# Patient Record
Sex: Female | Born: 1975 | Race: White | Hispanic: No | Marital: Married | Smoking: Never smoker
Health system: Southern US, Community
[De-identification: ages and names within clinical notes are randomized; demographics above are authoritative.]

## PROBLEM LIST (undated history)

## (undated) DIAGNOSIS — H1010 Acute atopic conjunctivitis, unspecified eye: Secondary | ICD-10-CM

## (undated) DIAGNOSIS — H7291 Unspecified perforation of tympanic membrane, right ear: Secondary | ICD-10-CM

## (undated) DIAGNOSIS — N390 Urinary tract infection, site not specified: Secondary | ICD-10-CM

## (undated) DIAGNOSIS — F32A Depression, unspecified: Secondary | ICD-10-CM

## (undated) DIAGNOSIS — J309 Allergic rhinitis, unspecified: Secondary | ICD-10-CM

## (undated) DIAGNOSIS — H900 Conductive hearing loss, bilateral: Secondary | ICD-10-CM

## (undated) DIAGNOSIS — F329 Major depressive disorder, single episode, unspecified: Secondary | ICD-10-CM

## (undated) DIAGNOSIS — M2021 Hallux rigidus, right foot: Secondary | ICD-10-CM

## (undated) DIAGNOSIS — J45909 Unspecified asthma, uncomplicated: Secondary | ICD-10-CM

## (undated) HISTORY — DX: Acute atopic conjunctivitis, unspecified eye: H10.10

## (undated) HISTORY — DX: Acute atopic conjunctivitis, unspecified eye: J30.9

## (undated) HISTORY — PX: MIDDLE EAR SURGERY: SHX713

## (undated) HISTORY — DX: Depression, unspecified: F32.A

## (undated) HISTORY — PX: TYMPANOSTOMY TUBE PLACEMENT: SHX32

## (undated) HISTORY — DX: Major depressive disorder, single episode, unspecified: F32.9

## (undated) HISTORY — PX: TONSILLECTOMY: SUR1361

## (undated) HISTORY — DX: Unspecified perforation of tympanic membrane, right ear: H72.91

## (undated) HISTORY — DX: Hallux rigidus, right foot: M20.21

## (undated) HISTORY — DX: Urinary tract infection, site not specified: N39.0

## (undated) HISTORY — DX: Unspecified asthma, uncomplicated: J45.909

## (undated) HISTORY — DX: Conductive hearing loss, bilateral: H90.0

---

## 2005-08-25 ENCOUNTER — Other Ambulatory Visit: Admission: RE | Admit: 2005-08-25 | Discharge: 2005-08-25 | Payer: Self-pay | Admitting: Obstetrics and Gynecology

## 2006-03-11 ENCOUNTER — Inpatient Hospital Stay (HOSPITAL_COMMUNITY): Admission: AD | Admit: 2006-03-11 | Discharge: 2006-03-14 | Payer: Self-pay | Admitting: Obstetrics and Gynecology

## 2016-07-13 DIAGNOSIS — Z6823 Body mass index (BMI) 23.0-23.9, adult: Secondary | ICD-10-CM | POA: Diagnosis not present

## 2016-07-13 DIAGNOSIS — Z1231 Encounter for screening mammogram for malignant neoplasm of breast: Secondary | ICD-10-CM | POA: Diagnosis not present

## 2016-07-13 DIAGNOSIS — Z01419 Encounter for gynecological examination (general) (routine) without abnormal findings: Secondary | ICD-10-CM | POA: Diagnosis not present

## 2017-07-20 DIAGNOSIS — Z1212 Encounter for screening for malignant neoplasm of rectum: Secondary | ICD-10-CM | POA: Diagnosis not present

## 2017-07-20 DIAGNOSIS — Z01419 Encounter for gynecological examination (general) (routine) without abnormal findings: Secondary | ICD-10-CM | POA: Diagnosis not present

## 2017-07-20 DIAGNOSIS — Z1231 Encounter for screening mammogram for malignant neoplasm of breast: Secondary | ICD-10-CM | POA: Diagnosis not present

## 2017-07-20 DIAGNOSIS — Z6823 Body mass index (BMI) 23.0-23.9, adult: Secondary | ICD-10-CM | POA: Diagnosis not present

## 2018-03-05 DIAGNOSIS — J0110 Acute frontal sinusitis, unspecified: Secondary | ICD-10-CM | POA: Diagnosis not present

## 2018-04-14 ENCOUNTER — Ambulatory Visit: Payer: Self-pay | Admitting: Family Medicine

## 2018-04-18 ENCOUNTER — Encounter: Payer: Self-pay | Admitting: Family Medicine

## 2018-04-18 ENCOUNTER — Ambulatory Visit: Payer: BLUE CROSS/BLUE SHIELD | Admitting: Family Medicine

## 2018-04-18 VITALS — BP 126/74 | HR 74 | Temp 98.1°F | Resp 16 | Ht 64.0 in | Wt 148.2 lb

## 2018-04-18 DIAGNOSIS — J3089 Other allergic rhinitis: Secondary | ICD-10-CM

## 2018-04-18 DIAGNOSIS — F329 Major depressive disorder, single episode, unspecified: Secondary | ICD-10-CM | POA: Insufficient documentation

## 2018-04-18 DIAGNOSIS — J329 Chronic sinusitis, unspecified: Secondary | ICD-10-CM | POA: Diagnosis not present

## 2018-04-18 DIAGNOSIS — F32A Depression, unspecified: Secondary | ICD-10-CM | POA: Insufficient documentation

## 2018-04-18 MED ORDER — FLUTICASONE PROPIONATE 50 MCG/ACT NA SUSP: 2.0000 | Freq: Every day | NASAL | 6 refills | Status: DC

## 2018-04-18 MED ORDER — CLINDAMYCIN HCL 300 MG PO CAPS: ORAL_CAPSULE | ORAL | 0 refills | Status: DC

## 2018-04-18 MED ORDER — PREDNISONE 20 MG PO TABS: ORAL_TABLET | ORAL | 0 refills | Status: DC

## 2018-04-18 NOTE — Patient Instructions (Signed)
Buy generic over the counter eye drops for itchy/allergy eyes called zaditor.  Use saline nasal spray: 2 sprays each nostril 2-3 times per day to irrigate and moisturize your nasal passages.  Xray of your sinuses has been ordered for [facility].

## 2018-04-18 NOTE — Progress Notes (Signed)
Office Note 04/20/2018  CC:  Chief Complaint  Patient presents with  . Establish Care    Previous PCP: no PCP, only seeing GYN prn check ups  . Sinus Issues    HPI:  Angela Harper is a 42 y.o. female who is here to establish care and discuss "sinus issues" Patient's most recent primary MD: GYN MD-->[facility]. Old records were not reviewed prior to or during today's visit.  "Sinus issues": Onset about 12 yrs ago while pregnant, nasal congestion, was put on zyrtec. Worsening over the last 2 yrs or so, EXTRA bad the last 1 yr. Chronic sneezing, nasal congestion/drainage, dry/itchy feeling in throat causes cough. Saline spray in nose doesn't feel like it gets through.  Lots of sinus pressure on/off. Swollen under eyes, dark circles.  Had a sinus infection about 1 mo ago, took abx, and sx's improved--back to her baseline. Has had some wheezing and some tightness in chest.  Snoring much worse lately.  Some feeling of SOB--primarily b/c she feels like she cannot breath out of her nose well. She did have EIB as a child.  Has noted decreased hearing in ears lately. Lots of nasal/postnasal mucous. No hx of x-ray of sinuses and has never seen an ENT or allergist for these problems.  Past Medical History:  Diagnosis Date  . Depression   . UTI (urinary tract infection)     Past Surgical History:  Procedure Laterality Date  . 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  . TONSILLECTOMY     child  . TYMPANOSTOMY TUBE PLACEMENT     child    Family History  Problem Relation Age of Onset  . Hearing loss Father     Social History   Socioeconomic History  . Marital status: Married    Spouse name: Not on file  . Number of children: Not on file  . Years of education: Not on file  . Highest education level: Not on file  Occupational History  . Not on file  Social Needs  . Financial  resource strain: Not on file  . Food insecurity:    Worry: Not on file    Inability: Not on file  . Transportation needs:    Medical: Not on file    Non-medical: Not on file  Tobacco Use  . Smoking status: Never Smoker  . Smokeless tobacco: Never Used  Substance and Sexual Activity  . Alcohol use: Not on file  . Drug use: Not on file  . Sexual activity: Not on file  Lifestyle  . Physical activity:    Days per week: Not on file    Minutes per session: Not on file  . Stress: Not on file  Relationships  . Social connections:    Talks on phone: Not on file    Gets together: Not on file    Attends religious service: Not on file    Active member of club or organization: Not on file    Attends meetings of clubs or organizations: Not on file    Relationship status: Not on file  . Intimate partner violence:    Fear of current or ex partner: Not on file    Emotionally abused: Not on file    Physically abused: Not on file    Forced sexual activity: Not on file  Other Topics Concern  . Not on file  Social History Narrative  . Not  on file    Outpatient Encounter Medications as of 04/18/2018  Medication Sig  . LO LOESTRIN FE 1 MG-10 MCG / 10 MCG tablet Take 1 tablet by mouth daily.  . Multiple Vitamin (MULTIVITAMIN) tablet Take 1 tablet by mouth daily.  . Probiotic Product (PROBIOTIC PO) Take 1 capsule by mouth daily.  . clindamycin (CLEOCIN) 300 MG capsule 1 tab po with food tid x 14d  . fluticasone (FLONASE) 50 MCG/ACT nasal spray Place 2 sprays into both nostrils daily.  . predniSONE (DELTASONE) 20 MG tablet 2 tabs po qd x 5d, then 1 tab po qd x 5d   No facility-administered encounter medications on file as of 04/18/2018.     Allergies  Allergen Reactions  . Codeine Nausea Only    ROS Review of Systems  Constitutional: Negative for fatigue and fever.  HENT: Positive for congestion, ear pain, facial swelling, postnasal drip, rhinorrhea, sinus pressure, sinus pain and  sneezing. Negative for sore throat.        See hpi  Eyes: Negative for visual disturbance.  Respiratory: Positive for chest tightness and wheezing. Negative for cough.        See hpi  Cardiovascular: Negative for chest pain.  Gastrointestinal: Negative for abdominal pain and nausea.  Genitourinary: Negative for dysuria.  Musculoskeletal: Negative for back pain and joint swelling.  Skin: Negative for rash.  Neurological: Negative for weakness and headaches.  Hematological: Negative for adenopathy.    PE; Blood pressure 126/74, pulse 74, temperature 98.1 F (36.7 C), temperature source Oral, resp. rate 16, height 5\' 4"  (1.626 m), weight 148 lb 4 oz (67.2 kg), SpO2 100 %. Body mass index is 25.45 kg/m.  VS: noted--normal. Gen: alert, NAD, Well APPEARING. HEENT: eyes without injection, drainage, or swelling.  Ears: EACs clear, R TM is pearly white, with no light reflex.  TM is not translucent.  No erythema or bulging.  No perforation.  L TM with normal translucency, with some calcification/scarring on posterior 1/3.  No erythema or bulging or middle ear fluid noted.  TM intact.  Nose: Clear rhinorrhea, with some dried, crusty exudate adherent to mildly injected mucosa.  No purulent d/c.  Diffuse mild paranasal and frontal sinus TTP.  No facial swelling.  Throat and mouth without focal lesion.  No pharyngial swelling, erythema, or exudate.  Soft palate: uvula is normal size and is midline, but the left side of the soft palate droops (or the right side is slightly atrophied).  Both sides of soft palate elevate similarly.  No tonsillar tissue. Neck: supple, no LAD.   LUNGS: CTA bilat, nonlabored resps.   CV: RRR, no m/r/g. EXT: no c/c/e SKIN: no rash   Pertinent labs:  none  ASSESSMENT AND PLAN:   New pt:  All primary care via her GYN MD up to this point.  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 X-ray sinuses ordered. Clindamycin 300 tid x  14d. Prednisone 40 mg qd x 5d, then 20mg  qd x 5d. Continue daily zyrtec (she finds that this works better than any other nonsedating antihistamine for her), add flonase daily. Saline nasal spray treatments recommended. For eyes, try otc generic zaditor drops. Consider allergist vs ENT referral.  Spent 30 min with pt today, with >50% of this time spent in counseling and care coordination regarding the above problems.  An After Visit Summary was printed and given to the patient.  Return in about 3 weeks (around 05/09/2018) for f/u allergies.  Signed:  Santiago Bumpers, MD           04/20/2018

## 2018-04-20 ENCOUNTER — Encounter: Payer: Self-pay | Admitting: Family Medicine

## 2018-05-18 ENCOUNTER — Ambulatory Visit (HOSPITAL_BASED_OUTPATIENT_CLINIC_OR_DEPARTMENT_OTHER)
Admission: RE | Admit: 2018-05-18 | Discharge: 2018-05-18 | Disposition: A | Discharge status: Home / Self Care | Payer: BLUE CROSS/BLUE SHIELD | Source: Ambulatory Visit | Attending: Family Medicine | Admitting: Family Medicine

## 2018-05-18 DIAGNOSIS — J329 Chronic sinusitis, unspecified: Secondary | ICD-10-CM | POA: Diagnosis not present

## 2018-05-18 DIAGNOSIS — J3089 Other allergic rhinitis: Secondary | ICD-10-CM

## 2018-05-18 DIAGNOSIS — J309 Allergic rhinitis, unspecified: Secondary | ICD-10-CM | POA: Diagnosis not present

## 2018-05-18 DIAGNOSIS — J342 Deviated nasal septum: Secondary | ICD-10-CM | POA: Diagnosis not present

## 2018-05-27 ENCOUNTER — Encounter: Payer: Self-pay | Admitting: Family Medicine

## 2018-05-27 ENCOUNTER — Ambulatory Visit: Payer: BLUE CROSS/BLUE SHIELD | Admitting: Family Medicine

## 2018-05-27 VITALS — BP 119/77 | HR 65 | Temp 98.2°F | Resp 16 | Ht 64.0 in | Wt 150.1 lb

## 2018-05-27 DIAGNOSIS — H66016 Acute suppurative otitis media with spontaneous rupture of ear drum, recurrent, bilateral: Secondary | ICD-10-CM | POA: Diagnosis not present

## 2018-05-27 DIAGNOSIS — J329 Chronic sinusitis, unspecified: Secondary | ICD-10-CM

## 2018-05-27 DIAGNOSIS — H9193 Unspecified hearing loss, bilateral: Secondary | ICD-10-CM | POA: Diagnosis not present

## 2018-05-27 DIAGNOSIS — J309 Allergic rhinitis, unspecified: Secondary | ICD-10-CM

## 2018-05-27 DIAGNOSIS — J4520 Mild intermittent asthma, uncomplicated: Secondary | ICD-10-CM

## 2018-05-27 MED ORDER — CEFDINIR 300 MG PO CAPS: 300.0000 mg | ORAL_CAPSULE | Freq: Two times a day (BID) | ORAL | 0 refills | Status: AC

## 2018-05-27 MED ORDER — OFLOXACIN 0.3 % OT SOLN: 10.0000 [drp] | Freq: Every day | OTIC | 0 refills | Status: AC

## 2018-05-27 MED ORDER — MONTELUKAST SODIUM 10 MG PO TABS: 10.0000 mg | ORAL_TABLET | Freq: Every day | ORAL | 3 refills | Status: DC

## 2018-05-27 MED ORDER — ALBUTEROL SULFATE HFA 108 (90 BASE) MCG/ACT IN AERS: 2.0000 | INHALATION_SPRAY | RESPIRATORY_TRACT | 0 refills | Status: DC | PRN

## 2018-05-27 NOTE — Progress Notes (Signed)
OFFICE VISIT  05/29/2018   CC:  Chief Complaint  Patient presents with  . Follow-up    allergies   HPI:    Patient is a 42 y.o. Caucasian female who presents for 5 week f/u allergic rhinitis and sinusitis problems. Assessment and plan at last visit:  Severe chronic rhinosinusitis: combination of allergic and infectious causes. X-ray sinuses ordered. Clindamycin 300 tid x 14d. Prednisone 40 mg qd x 5d, then 20mg  qd x 5d. Continue daily zyrtec (she finds that this works better than any other nonsedating antihistamine for her), add flonase daily. Saline nasal spray treatments recommended. For eyes, try otc generic zaditor drops. Consider allergist vs ENT referral.  Interim hx: X-ray sinuses showed sinuses all completely normal.  She did get better for a while-->Was sneezing less, eyes not as itchy, not as congested in sinuses/nose.  Throat scratchy and dry cough better.  Then all sx's returned to the pretreatment level. She does feel like she sometimes can't get deep breaths, worse at night and when supine. Occ hears wheeze.  Has long hx of hearing deficit per her report due to recurrent ear infections as a child. Asks if she can be referred to an ENT for this, esp since she has hx of middle ear reconstructive surgery.     Past Medical History:  Diagnosis Date  . Depression   . UTI (urinary tract infection)     Past Surgical History:  Procedure Laterality Date  . 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  . TONSILLECTOMY     child  . TYMPANOSTOMY TUBE PLACEMENT Bilateral    child    Outpatient Medications Prior to Visit  Medication Sig Dispense Refill  . fluticasone (FLONASE) 50 MCG/ACT nasal spray Place 2 sprays into both nostrils daily. 16 g 6  . LO LOESTRIN FE 1 MG-10 MCG / 10 MCG tablet Take 1 tablet by mouth daily.  2  . Multiple Vitamin  (MULTIVITAMIN) tablet Take 1 tablet by mouth daily.    . Probiotic Product (PROBIOTIC PO) Take 1 capsule by mouth daily.    . clindamycin (CLEOCIN) 300 MG capsule 1 tab po with food tid x 14d (Patient not taking: Reported on 05/27/2018) 42 capsule 0  . predniSONE (DELTASONE) 20 MG tablet 2 tabs po qd x 5d, then 1 tab po qd x 5d (Patient not taking: Reported on 05/27/2018) 15 tablet 0   No facility-administered medications prior to visit.     Allergies  Allergen Reactions  . Codeine Nausea Only    ROS As per HPI  PE: Blood pressure 119/77, pulse 65, temperature 98.2 F (36.8 C), temperature source Oral, resp. rate 16, height 5\' 4"  (1.626 m), weight 150 lb 2 oz (68.1 kg), SpO2 100 %. VS: noted--normal. Gen: alert, NAD, NONTOXIC APPEARING. HEENT: eyes without injection, drainage, or swelling.  Ears: EACs clear, TMs with normal light reflex and landmarks.  Nose: No rhinorrhea.  Minimal crusty exudate adherent to mildly injected and edematous mucosa.  No purulent d/c.  No paranasal sinus TTP.  No facial swelling.  Throat and mouth without focal lesion.  No pharyngial swelling, erythema, or exudate.   Neck: supple, no LAD.   LUNGS: CTA bilat, nonlabored resps.   CV: RRR, no m/r/g. EXT: no c/c/e SKIN: no rash  LABS: None    IMPRESSION AND PLAN:  1) Allergic rhinitis, chronic.  Severe  sx's recently, mild short term improvement with prednisone and clinamycin tx. Referred pt to allergist for possible allergy testing and expert recommendations. Encouraged pt to take her flonase DAILY and not just prn. She can continue nonsedating antihistamine daily on prn basis. Start singulair 10mg  qd.  2) Intermittent asthma suspected: trial of ventolin 2 p q4h prn. Allergy/asthma specialist referral. Singulair started.  3) R acute OM, with chronic TM perforation: cefdinir 300 mg bid x 7d. Floxic otic drops: 10 gtts in R ear qd.  4) Hearing loss AU: hx of recurrent OM and hx of middle ear  reconstruction surgery. ENT referral ordered today. Audiogram today showed only small deficit in both ears at 500, 1000, and 2000 Hz frequencies.  An After Visit Summary was printed and given to the patient.  FOLLOW UP: Return for as needed.  Signed:  Santiago BumpersPhil Mateo Overbeck, MD           05/29/2018

## 2018-06-02 DIAGNOSIS — H900 Conductive hearing loss, bilateral: Secondary | ICD-10-CM | POA: Diagnosis not present

## 2018-06-02 DIAGNOSIS — H7201 Central perforation of tympanic membrane, right ear: Secondary | ICD-10-CM | POA: Diagnosis not present

## 2018-06-02 DIAGNOSIS — H7291 Unspecified perforation of tympanic membrane, right ear: Secondary | ICD-10-CM | POA: Diagnosis not present

## 2018-06-02 DIAGNOSIS — H6983 Other specified disorders of Eustachian tube, bilateral: Secondary | ICD-10-CM | POA: Diagnosis not present

## 2018-06-21 ENCOUNTER — Encounter: Payer: Self-pay | Admitting: Allergy and Immunology

## 2018-06-21 ENCOUNTER — Ambulatory Visit: Payer: BLUE CROSS/BLUE SHIELD | Admitting: Allergy and Immunology

## 2018-06-21 VITALS — BP 118/82 | HR 80 | Resp 16 | Ht 64.0 in | Wt 148.0 lb

## 2018-06-21 DIAGNOSIS — H1013 Acute atopic conjunctivitis, bilateral: Secondary | ICD-10-CM

## 2018-06-21 DIAGNOSIS — J302 Other seasonal allergic rhinitis: Secondary | ICD-10-CM | POA: Insufficient documentation

## 2018-06-21 DIAGNOSIS — H1010 Acute atopic conjunctivitis, unspecified eye: Secondary | ICD-10-CM | POA: Insufficient documentation

## 2018-06-21 DIAGNOSIS — J3089 Other allergic rhinitis: Secondary | ICD-10-CM | POA: Diagnosis not present

## 2018-06-21 DIAGNOSIS — J4530 Mild persistent asthma, uncomplicated: Secondary | ICD-10-CM | POA: Diagnosis not present

## 2018-06-21 MED ORDER — LEVOCETIRIZINE DIHYDROCHLORIDE 5 MG PO TABS: 5.0000 mg | ORAL_TABLET | Freq: Every evening | ORAL | 5 refills | Status: DC

## 2018-06-21 MED ORDER — FLUTICASONE PROPIONATE 93 MCG/ACT NA EXHU: 2.0000 | INHALANT_SUSPENSION | Freq: Two times a day (BID) | NASAL | 3 refills | Status: DC

## 2018-06-21 MED ORDER — OLOPATADINE HCL 0.7 % OP SOLN: 1.0000 [drp] | OPHTHALMIC | 5 refills | Status: DC

## 2018-06-21 NOTE — Assessment & Plan Note (Signed)
   Treatment plan as outlined above for allergic rhinitis.  A prescription has been provided for Pazeo, one drop per eye daily as needed.  I have also recommended eye lubricant drops (i.e., Natural Tears) as needed. 

## 2018-06-21 NOTE — Patient Instructions (Addendum)
Seasonal and perennial allergic rhinitis  Aeroallergen avoidance measures have been discussed and provided in written form.  A prescription has been provided for levocetirizine, 5 mg daily as needed.  A prescription has been provided for [facility], 2 actuations per nostril twice a day. Proper technique has been discussed and demonstrated.  Nasal saline spray (i.e., Simply Saline) or nasal saline lavage (i.e., NeilMed) is recommended as needed and prior to medicated nasal sprays.  If allergen avoidance measures and medications fail to adequately relieve symptoms, aeroallergen immunotherapy will be considered.  Allergic conjunctivitis  Treatment plan as outlined above for allergic rhinitis.  A prescription has been provided for Pazeo, one drop per eye daily as needed.  I have also recommended eye lubricant drops (i.e., Natural Tears) as needed.  Mild asthma  Continue montelukast 10 mg daily at bedtime and albuterol HFA, 1 to 2 inhalations every 4-6 hours if needed.  I have also recommended using albuterol 15 minutes prior to vigorous exercise.  Subjective and objective measures of pulmonary function will be followed and the treatment plan will be adjusted accordingly.   Return in about 3 months (around 09/20/2018), or if symptoms worsen or fail to improve.  Control of Dust Mite Allergen  House dust mites play a major role in allergic asthma and rhinitis.  They occur in environments with high humidity wherever human skin, the food for dust mites is found. High levels have been detected in dust obtained from mattresses, pillows, carpets, upholstered furniture, bed covers, clothes and soft toys.  The principal allergen of the house dust mite is found in its feces.  A gram of dust may contain 1,000 mites and 250,000 fecal particles.  Mite antigen is easily measured in the air during house cleaning activities.    1. Encase mattresses, including the box spring, and pillow, in an air tight  cover.  Seal the zipper end of the encased mattresses with wide adhesive tape. 2. Wash the bedding in water of 130 degrees Farenheit weekly.  Avoid cotton comforters/quilts and flannel bedding: the most ideal bed covering is the dacron comforter. 3. Remove all upholstered furniture from the bedroom. 4. Remove carpets, carpet padding, rugs, and non-washable window drapes from the bedroom.  Wash drapes weekly or use plastic window coverings. 5. Remove all non-washable stuffed toys from the bedroom.  Wash stuffed toys weekly. 6. Have the room cleaned frequently with a vacuum cleaner and a damp dust-mop.  The patient should not be in a room which is being cleaned and should wait 1 hour after cleaning before going into the room. 7. Close and seal all heating outlets in the bedroom.  Otherwise, the room will become filled with dust-laden air.  An electric heater can be used to heat the room. Reduce indoor humidity to less than 50%.  Do not use a humidifier.   Reducing Pollen Exposure  The American Academy of Allergy, Asthma and Immunology suggests the following steps to reduce your exposure to pollen during allergy seasons.    1. Do not hang sheets or clothing out to dry; pollen may collect on these items. 2. Do not mow lawns or spend time around freshly cut grass; mowing stirs up pollen. 3. Keep windows closed at night.  Keep car windows closed while driving. 4. Minimize morning activities outdoors, a time when pollen counts are usually at their highest. 5. Stay indoors as much as possible when pollen counts or humidity is high and on windy days when pollen tends to remain in  the air longer. 6. Use air conditioning when possible.  Many air conditioners have filters that trap the pollen spores. 7. Use a HEPA room air filter to remove pollen form the indoor air you breathe.   Control of Dog or Cat Allergen  Avoidance is the best way to manage a dog or cat allergy. If you have a dog or cat and are  allergic to dog or cats, consider removing the dog or cat from the home. If you have a dog or cat but don't want to find it a new home, or if your family wants a pet even though someone in the household is allergic, here are some strategies that may help keep symptoms at bay:  1. Keep the pet out of your bedroom and restrict it to only a few rooms. Be advised that keeping the dog or cat in only one room will not limit the allergens to that room. 2. Don't pet, hug or kiss the dog or cat; if you do, wash your hands with soap and water. 3. High-efficiency particulate air (HEPA) cleaners run continuously in a bedroom or living room can reduce allergen levels over time. 4. Place electrostatic material sheet in the air inlet vent in the bedroom. 5. Regular use of a high-efficiency vacuum cleaner or a central vacuum can reduce allergen levels. 6. Giving your dog or cat a bath at least once a week can reduce airborne allergen.   Control of Mold Allergen  Mold and fungi can grow on a variety of surfaces provided certain temperature and moisture conditions exist.  Outdoor molds grow on plants, decaying vegetation and soil.  The major outdoor mold, Alternaria and Cladosporium, are found in very high numbers during hot and dry conditions.  Generally, a late Summer - Fall peak is seen for common outdoor fungal spores.  Rain will temporarily lower outdoor mold spore count, but counts rise rapidly when the rainy period ends.  The most important indoor molds are Aspergillus and Penicillium.  Dark, humid and poorly ventilated basements are ideal sites for mold growth.  The next most common sites of mold growth are the bathroom and the kitchen.  Outdoor MicrosoftMold Control 1. Use air conditioning and keep windows closed 2. Avoid exposure to decaying vegetation. 3. Avoid leaf raking. 4. Avoid grain handling. 5. Consider wearing a face mask if working in moldy areas.  Indoor Mold Control 1. Maintain humidity below  50%. 2. Clean washable surfaces with 5% bleach solution. 3. Remove sources e.g. Contaminated carpets.   Control of Cockroach Allergen  Cockroach allergen has been identified as an important cause of acute attacks of asthma, especially in urban settings.  There are fifty-five species of cockroach that exist in the [place], however only three, the [place], [place] and Oriental species produce allergen that can affect patients with Asthma.  Allergens can be obtained from fecal particles, egg casings and secretions from cockroaches.    1. Remove food sources. 2. Reduce access to water. 3. Seal access and entry points. 4. Spray runways with 0.5-1% Diazinon or Chlorpyrifos 5. Blow boric acid power under stoves and refrigerator. 6. Place bait stations (hydramethylnon) at feeding sites.

## 2018-06-21 NOTE — Progress Notes (Signed)
New Patient Note  RE: Angela Harper MRN: [id number] DOB: [birth year] Date of Office Visit: 06/21/2018  Referring provider: Jeoffrey MassedMcGowen, Philip H, MD Primary care provider: Jeoffrey MassedMcGowen, Philip H, MD  Chief Complaint: Nasal Congestion and Itchy Eye   History of present illness: Angela Harper is a 42 y.o. female seen today in consultation requested by Nicoletta BaPhilip McGowen, MD.  She reports that she began to experience nasal and ocular allergy symptoms approximately 20 years ago during pregnancy.  This problem has progressed significantly over the past year.  Her symptoms consist of "very powerful, terrifying sneezes", nasal congestion, rhinorrhea, thick postnasal drainage, nasal pruritus, ocular pruritus, and mild eyelid edema.  No significant seasonal symptom variation has been noted nor have specific environmental triggers been identified.  She currently attempts to control the symptoms with fluticasone nasal spray and cetirizine or cetirizine/pseudoephedrine.  She reports that she has had a nonproductive cough over the past 3 months.  In early December she had symptoms consistent with a sinus infection and during that time experienced chest tightness and wheezing.  She states that she was diagnosed with asthma earlier this month and was prescribed montelukast 10 mg daily at bedtime and albuterol as needed.  She has required albuterol rescue 1 time per week on average and has experienced relief from the albuterol.  Assessment and plan: Seasonal and perennial allergic rhinitis  Aeroallergen avoidance measures have been discussed and provided in written form.  A prescription has been provided for levocetirizine, 5 mg daily as needed.  A prescription has been provided [facility], 2 actuations per nostril twice a day. Proper technique has been discussed and demonstrated.  Nasal saline spray (i.e., Simply Saline) or nasal saline lavage (i.e., NeilMed) is recommended as needed and prior to medicated  nasal sprays.  If allergen avoidance measures and medications fail to adequately relieve symptoms, aeroallergen immunotherapy will be considered.  Allergic conjunctivitis  Treatment plan as outlined above for allergic rhinitis.  A prescription has been provided for Pazeo, one drop per eye daily as needed.  I have also recommended eye lubricant drops (i.e., Natural Tears) as needed.  Mild asthma  Continue montelukast 10 mg daily at bedtime and albuterol HFA, 1 to 2 inhalations every 4-6 hours if needed.  I have also recommended using albuterol 15 minutes prior to vigorous exercise.  Subjective and objective measures of pulmonary function will be followed and the treatment plan will be adjusted accordingly.   Meds ordered this encounter  Medications  . Fluticasone Propionate (XHANCE) 93 MCG/ACT EXHU    Sig: Place 2 sprays into the nose 2 (two) times daily.    Dispense:  16 mL    Refill:  3  . Olopatadine HCl (PAZEO) 0.7 % SOLN    Sig: Place 1 drop into both eyes 1 day or 1 dose.    Dispense:  1 Bottle    Refill:  5  . levocetirizine (XYZAL) 5 MG tablet    Sig: Take 1 tablet (5 mg total) by mouth every evening.    Dispense:  30 tablet    Refill:  5    Diagnostics: Spirometry: Normal with an FEV1 of 107% predicted and an FEV1 ratio of 98%.  Please see scanned spirometry results for details. Environmental epicutaneous testing: Positive to grass pollen Environmental intradermal testing: Positive to ragweed pollen, molds cat hair, dog epithelia, cockroach antigen, and dust mite antigen.    Physical examination: Blood pressure 118/82, pulse 80, resp. rate 16, height 5\' 4"  (1.626  m), weight 148 lb (67.1 kg), SpO2 100 %.  General: Alert, interactive, in no acute distress. HEENT: TMs pearly gray, turbinates moderately edematous with clear discharge, post-pharynx erythematous. Neck: Supple without lymphadenopathy. Lungs: Clear to auscultation without wheezing, rhonchi or  rales. CV: Normal S1, S2 without murmurs. Abdomen: Nondistended, nontender. Skin: Warm and dry, without lesions or rashes. Extremities:  No clubbing, cyanosis or edema. Neuro:   Grossly intact.  Review of systems:  Review of systems negative except as noted in HPI / PMHx or noted below: Review of Systems  Constitutional: Negative.   HENT: Negative.   Eyes: Negative.   Respiratory: Negative.   Cardiovascular: Negative.   Gastrointestinal: Negative.   Genitourinary: Negative.   Musculoskeletal: Negative.   Skin: Negative.   Neurological: Negative.   Endo/Heme/Allergies: Negative.   Psychiatric/Behavioral: Negative.     Past medical history:  Past Medical History:  Diagnosis Date  . Asthma   . Depression   . UTI (urinary tract infection)     Past surgical history:  Past Surgical History:  Procedure Laterality Date  . CESAREAN SECTION    . 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  . TONSILLECTOMY     child  . TONSILLECTOMY    . TYMPANOSTOMY TUBE PLACEMENT Bilateral    child    Family history: Family History  Problem Relation Age of Onset  . Hearing loss Father   . Hypertension Father     Social history: Social History   Socioeconomic History  . Marital status: Married    Spouse name: Not on file  . Number of children: Not on file  . Years of education: Not on file  . Highest education level: Not on file  Occupational History  . Not on file  Social Needs  . Financial resource strain: Not on file  . Food insecurity:    Worry: Not on file    Inability: Not on file  . Transportation needs:    Medical: Not on file    Non-medical: Not on file  Tobacco Use  . Smoking status: Never Smoker  . Smokeless tobacco: Never Used  Substance and Sexual Activity  . Alcohol use: Not on file  . Drug use: Not on file  . Sexual activity: Not on  file  Lifestyle  . Physical activity:    Days per week: Not on file    Minutes per session: Not on file  . Stress: Not on file  Relationships  . Social connections:    Talks on phone: Not on file    Gets together: Not on file    Attends religious service: Not on file    Active member of club or organization: Not on file    Attends meetings of clubs or organizations: Not on file    Relationship status: Not on file  . Intimate partner violence:    Fear of current or ex partner: Not on file    Emotionally abused: Not on file    Physically abused: Not on file    Forced sexual activity: Not on file  Other Topics Concern  . Not on file  Social History Narrative  . Not on file   Environmental History: The patient lives in a 43 year old house with carpeting the bedroom and central air/heat.  There are 2 cats in the home which have access to her bedroom.  Allergies  as of 06/21/2018      Reactions   Codeine Nausea Only      Medication List       Accurate as of June 21, 2018  4:35 PM. Always use your most recent med list.        albuterol 108 (90 Base) MCG/ACT inhaler Commonly known as:  VENTOLIN HFA Inhale 2 puffs into the lungs every 4 (four) hours as needed for wheezing or shortness of breath.   fluticasone 50 MCG/ACT nasal spray Commonly known as:  FLONASE Place 2 sprays into both nostrils daily.   Fluticasone Propionate 93 MCG/ACT Exhu Commonly known as:  XHANCE Place 2 sprays into the nose 2 (two) times daily.   levocetirizine 5 MG tablet Commonly known as:  XYZAL Take 1 tablet (5 mg total) by mouth every evening.   LO LOESTRIN FE 1 MG-10 MCG / 10 MCG tablet Generic drug:  Norethindrone-Ethinyl Estradiol-Fe Biphas Take 1 tablet by mouth daily.   montelukast 10 MG tablet Commonly known as:  SINGULAIR Take 1 tablet (10 mg total) by mouth at bedtime.   multivitamin tablet Take 1 tablet by mouth daily.   Olopatadine HCl 0.7 % Soln Commonly known as:   PAZEO Place 1 drop into both eyes 1 day or 1 dose.   PROBIOTIC PO Take 1 capsule by mouth daily.       Known medication allergies: Allergies  Allergen Reactions  . Codeine Nausea Only    I appreciate the opportunity to take part in [facility] care. Please do not hesitate to contact me with questions.  Sincerely,   R. Jorene Guest, MD

## 2018-06-21 NOTE — Assessment & Plan Note (Signed)
   Aeroallergen avoidance measures have been discussed and provided in written form.  A prescription has been provided for levocetirizine, 5 mg daily as needed.  A prescription has been provided for Xhance, 2 actuations per nostril twice a day. Proper technique has been discussed and demonstrated.  Nasal saline spray (i.e., Simply Saline) or nasal saline lavage (i.e., NeilMed) is recommended as needed and prior to medicated nasal sprays.  If allergen avoidance measures and medications fail to adequately relieve symptoms, aeroallergen immunotherapy will be considered. 

## 2018-06-21 NOTE — Assessment & Plan Note (Signed)
   Continue montelukast 10 mg daily at bedtime and albuterol HFA, 1 to 2 inhalations every 4-6 hours if needed.  I have also recommended using albuterol 15 minutes prior to vigorous exercise.  Subjective and objective measures of pulmonary function will be followed and the treatment plan will be adjusted accordingly.

## 2018-06-28 DIAGNOSIS — H7091 Unspecified mastoiditis, right ear: Secondary | ICD-10-CM | POA: Diagnosis not present

## 2018-06-28 DIAGNOSIS — H900 Conductive hearing loss, bilateral: Secondary | ICD-10-CM | POA: Diagnosis not present

## 2018-06-28 DIAGNOSIS — H7201 Central perforation of tympanic membrane, right ear: Secondary | ICD-10-CM | POA: Diagnosis not present

## 2018-06-28 DIAGNOSIS — H6983 Other specified disorders of Eustachian tube, bilateral: Secondary | ICD-10-CM | POA: Diagnosis not present

## 2018-07-08 ENCOUNTER — Other Ambulatory Visit: Payer: Self-pay | Admitting: Family Medicine

## 2018-07-12 ENCOUNTER — Telehealth: Payer: Self-pay

## 2018-07-12 ENCOUNTER — Other Ambulatory Visit: Payer: Self-pay | Admitting: Otolaryngology

## 2018-07-12 DIAGNOSIS — H7091 Unspecified mastoiditis, right ear: Secondary | ICD-10-CM

## 2018-07-12 NOTE — Telephone Encounter (Signed)
Spoke with patient, she asked if it was okay to take an extra dose of levocetirizine if one was not clearing her symptoms.  I encouraged her to try taking one extra dose for one to two weeks to see if it helps, if it doesn't, she will call us back.

## 2018-07-18 ENCOUNTER — Ambulatory Visit
Admission: RE | Admit: 2018-07-18 | Discharge: 2018-07-18 | Disposition: A | Discharge status: Home / Self Care | Payer: BLUE CROSS/BLUE SHIELD | Source: Ambulatory Visit | Attending: Otolaryngology | Admitting: Otolaryngology

## 2018-07-18 ENCOUNTER — Other Ambulatory Visit: Payer: Self-pay | Admitting: Family Medicine

## 2018-07-18 ENCOUNTER — Other Ambulatory Visit: Payer: BLUE CROSS/BLUE SHIELD

## 2018-07-18 DIAGNOSIS — H7091 Unspecified mastoiditis, right ear: Secondary | ICD-10-CM

## 2018-07-18 DIAGNOSIS — H748X3 Other specified disorders of middle ear and mastoid, bilateral: Secondary | ICD-10-CM | POA: Diagnosis not present

## 2018-07-18 MED ORDER — IOPAMIDOL (ISOVUE-300) INJECTION 61%
75.0000 mL | Freq: Once | INTRAVENOUS | Status: AC | PRN
  Administered 2018-07-18: 75 mL via INTRAVENOUS

## 2018-09-13 ENCOUNTER — Ambulatory Visit: Payer: BLUE CROSS/BLUE SHIELD | Admitting: Allergy and Immunology

## 2018-09-25 ENCOUNTER — Encounter: Payer: Self-pay | Admitting: Family Medicine

## 2018-10-18 ENCOUNTER — Ambulatory Visit: Payer: BLUE CROSS/BLUE SHIELD | Admitting: Allergy and Immunology

## 2018-12-06 ENCOUNTER — Ambulatory Visit: Payer: Self-pay | Admitting: Allergy & Immunology

## 2018-12-06 ENCOUNTER — Other Ambulatory Visit: Payer: Self-pay

## 2018-12-13 ENCOUNTER — Encounter: Payer: Self-pay | Admitting: Allergy and Immunology

## 2018-12-13 ENCOUNTER — Other Ambulatory Visit: Payer: Self-pay

## 2018-12-13 ENCOUNTER — Ambulatory Visit (INDEPENDENT_AMBULATORY_CARE_PROVIDER_SITE_OTHER): Payer: 59 | Admitting: Allergy and Immunology

## 2018-12-13 DIAGNOSIS — J3089 Other allergic rhinitis: Secondary | ICD-10-CM | POA: Diagnosis not present

## 2018-12-13 DIAGNOSIS — J4530 Mild persistent asthma, uncomplicated: Secondary | ICD-10-CM

## 2018-12-13 DIAGNOSIS — H1013 Acute atopic conjunctivitis, bilateral: Secondary | ICD-10-CM

## 2018-12-13 MED ORDER — AZELASTINE HCL 0.15 % NA SOLN: 1.0000 | Freq: Two times a day (BID) | NASAL | 5 refills | Status: DC | PRN

## 2018-12-13 MED ORDER — LEVOCETIRIZINE DIHYDROCHLORIDE 5 MG PO TABS: 5.0000 mg | ORAL_TABLET | Freq: Every evening | ORAL | 5 refills | Status: DC

## 2018-12-13 NOTE — Patient Instructions (Addendum)
Seasonal and perennial allergic rhinitis Currently with suboptimal control.  A prescription has been provided for azelastine nasal spray, 1-2 sprays per nostril 2 times daily as needed. Proper nasal spray technique has been discussed.   If needed, may add fluticasone nasal spray.  Nasal saline spray (i.e., Simply Saline) or nasal saline lavage (i.e., NeilMed) is recommended as needed and prior to medicated nasal sprays.  A prescription has been provided for levocetirizine (Xyzal), 5 mg daily as needed.  To avoid diminishing benefit with daily use (tachyphylaxis) of second generation antihistamine, consider alternating every few months between fexofenadine (Allegra) and levocetirizine (Xyzal).  If allergen avoidance measures and medications fail to adequately relieve symptoms, aeroallergen immunotherapy will be considered.  Allergic conjunctivitis  Treatment plan as outlined above for allergic rhinitis.  Continue olopatadine eyedrops, 1 drop per eye daily if needed.  Mild asthma  Continue albuterol HFA, 1 to 2 inhalations every 4-6 hours if needed.  Subjective and objective measures of pulmonary function will be followed and the treatment plan will be adjusted accordingly.   Follow-up in 6 to 12 months or sooner if needed.

## 2018-12-13 NOTE — Progress Notes (Signed)
Follow-up Telemedicine Note  RE: Angela Harper MRN: [id number] DOB: July 30, 1975 Date of Telemedicine Visit: 12/13/2018  Primary care provider: Jeoffrey MassedMcGowen, Philip H, MD Referring provider: Jeoffrey MassedMcGowen, Philip H, MD  Telemedicine Follow Up Visit via Telephone: I connected with Angela Harper Harper for a follow up on 12/13/18 by telephone and verified that I am speaking with the correct person using two identifiers.   The limitations, risks, security and privacy concerns of performing an evaluation and management service by telemedicine, the availability of in person appointments, and that there may be a patient responsible charge related to this service were discussed. The patient expressed understanding and agreed to proceed.  Patient is at home.  Provider is at the office.  Visit start time: 1:34 PM Visit end time: 2:05 PM Insurance consent/check in by: Victorino DikeJennifer Medical consent and medical assistant/nurse: Toni Amendourtney  History of present illness: Angela Harper Mayon is a 43 y.o. female with mild persistent asthma and allergic rhinoconjunctivitis presenting today for follow-up.  She was previously seen in this clinic for her initial evaluation in December 2019.  She reports that her asthma has been well controlled in the interval since her previous visit.  Her normal daily activities are not limited by asthma symptoms and she is rarely awakened from sleep at night due to lower respiratory symptoms.  She discontinued montelukast because of the concern that it may be contributing to depression, though she suspects that the COVID-19 shelter in place orders were mainly to blame.  She currently takes cetirizine 10 mg daily and fluticasone nasal spray as needed/sporadically.  She reports that she has sneezing fits most mornings and also experiences nasal congestion along with occasional frontal sinus pressure.  Her ocular symptoms have been well controlled and she rarely requires olopatadine eyedrops.   Assessment and plan: Seasonal and perennial allergic rhinitis Currently with suboptimal control.  A prescription has been provided for azelastine nasal spray, 1-2 sprays per nostril 2 times daily as needed. Proper nasal spray technique has been discussed.   If needed, may add fluticasone nasal spray.  Nasal saline spray (i.e., Simply Saline) or nasal saline lavage (i.e., NeilMed) is recommended as needed and prior to medicated nasal sprays.  A prescription has been provided for levocetirizine (Xyzal), 5 mg daily as needed.  To avoid diminishing benefit with daily use (tachyphylaxis) of second generation antihistamine, consider alternating every few months between fexofenadine (Allegra) and levocetirizine (Xyzal).  If allergen avoidance measures and medications fail to adequately relieve symptoms, aeroallergen immunotherapy will be considered.  Allergic conjunctivitis  Treatment plan as outlined above for allergic rhinitis.  Continue olopatadine eyedrops, 1 drop per eye daily if needed.  Mild asthma  Continue albuterol HFA, 1 to 2 inhalations every 4-6 hours if needed.  Subjective and objective measures of pulmonary function will be followed and the treatment plan will be adjusted accordingly.   Meds ordered this encounter  Medications  . Azelastine HCl 0.15 % SOLN    Sig: Place 1-2 sprays into both nostrils 2 (two) times daily as needed.    Dispense:  30 mL    Refill:  5  . levocetirizine (XYZAL) 5 MG tablet    Sig: Take 1 tablet (5 mg total) by mouth every evening.    Dispense:  30 tablet    Refill:  5    Diagnostics: None.  Physical examination: Physical Exam Not obtained as encounter was done via telephone.   The following portions of the patient's history were reviewed and updated as appropriate:  allergies, current medications, past family history, past medical history, past social history, past surgical history and problem list.  Allergies as of 12/13/2018       Reactions   Codeine Nausea Only      Medication List       Accurate as of December 13, 2018  5:53 PM. If you have any questions, ask your nurse or doctor.        STOP taking these medications   montelukast 10 MG tablet Commonly known as: SINGULAIR Stopped by: Wellington Hampshire Carter Amarys Sliwinski, MD     TAKE these medications   albuterol 108 (90 Base) MCG/ACT inhaler Commonly known as: VENTOLIN HFA INHALE 2 PUFFS INTO THE LUNGS EVERY 4 (FOUR) HOURS AS NEEDED FOR WHEEZING OR SHORTNESS OF BREATH.   Azelastine HCl 0.15 % Soln Place 1-2 sprays into both nostrils 2 (two) times daily as needed. Started by: Wellington Hampshire Carter Marcelia Petersen, MD   cetirizine 10 MG tablet Commonly known as: ZYRTEC Take 10 mg by mouth daily.   fluticasone 50 MCG/ACT nasal spray Commonly known as: FLONASE Place 2 sprays into both nostrils daily.   Fluticasone Propionate 93 MCG/ACT Exhu Commonly known as: Xhance Place 2 sprays into the nose 2 (two) times daily.   levocetirizine 5 MG tablet Commonly known as: XYZAL Take 1 tablet (5 mg total) by mouth every evening.   Lo Loestrin Fe 1 MG-10 MCG / 10 MCG tablet Generic drug: Norethindrone-Ethinyl Estradiol-Fe Biphas Take 1 tablet by mouth daily.   multivitamin tablet Take 1 tablet by mouth daily.   Olopatadine HCl 0.7 % Soln Commonly known as: Pazeo Place 1 drop into both eyes 1 day or 1 dose.   PROBIOTIC PO Take 1 capsule by mouth daily.       Allergies  Allergen Reactions  . Codeine Nausea Only   Review of systems: Review of systems negative except as noted in HPI / PMHx or noted below: Constitutional: Negative.  HENT: Negative.   Eyes: Negative.  Respiratory: Negative.   Cardiovascular: Negative.  Gastrointestinal: Negative.  Genitourinary: Negative.  Musculoskeletal: Negative.  Neurological: Negative.  Endo/Heme/Allergies: Negative.  Cutaneous: Negative.  Past Medical History:  Diagnosis Date  . Asthma   . Conductive hearing loss, bilateral    Chronic R  TM perf.  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 Lazarus SalinesWolicki.(08/2018)  . Depression   . Perforation of right tympanic membrane    Chronic; tympanoplasty recommended 3/2020_>pt considering  . UTI (urinary tract infection)     Family History  Problem Relation Age of Onset  . Hearing loss Father   . Hypertension Father     Social History   Socioeconomic History  . Marital status: Married    Spouse name: Not on file  . Number of children: Not on file  . Years of education: Not on file  . Highest education level: Not on file  Occupational History  . Not on file  Social Needs  . Financial resource strain: Not on file  . Food insecurity    Worry: Not on file    Inability: Not on file  . Transportation needs    Medical: Not on file    Non-medical: Not on file  Tobacco Use  . Smoking status: Never Smoker  . Smokeless tobacco: Never Used  Substance and Sexual Activity  . Alcohol use: Not on file  . Drug use: Not on file  . Sexual activity: Not on file  Lifestyle  . Physical activity  Days per week: Not on file    Minutes per session: Not on file  . Stress: Not on file  Relationships  . Social Herbalist on phone: Not on file    Gets together: Not on file    Attends religious service: Not on file    Active member of club or organization: Not on file    Attends meetings of clubs or organizations: Not on file    Relationship status: Not on file  . Intimate partner violence    Fear of current or ex partner: Not on file    Emotionally abused: Not on file    Physically abused: Not on file    Forced sexual activity: Not on file  Other Topics Concern  . Not on file  Social History Narrative  . Not on file    Previous notes and tests were reviewed.  I discussed the assessment and treatment plan with the patient. The patient was provided an opportunity to ask questions and all were answered. The patient agreed with the plan and  demonstrated an understanding of the instructions.   The patient was advised to call back or seek an in-person evaluation if the symptoms worsen or if the condition fails to improve as anticipated.  I provided 31 minutes of non-face-to-face time during this encounter.  I appreciate the opportunity to take part in [facility] care. Please do not hesitate to contact me with questions.  Sincerely,   R. Edgar Frisk, MD

## 2018-12-13 NOTE — Assessment & Plan Note (Signed)
Currently with suboptimal control.  A prescription has been provided for azelastine nasal spray, 1-2 sprays per nostril 2 times daily as needed. Proper nasal spray technique has been discussed.   If needed, may add fluticasone nasal spray.  Nasal saline spray (i.e., Simply Saline) or nasal saline lavage (i.e., NeilMed) is recommended as needed and prior to medicated nasal sprays.  A prescription has been provided for levocetirizine (Xyzal), 5 mg daily as needed.  To avoid diminishing benefit with daily use (tachyphylaxis) of second generation antihistamine, consider alternating every few months between fexofenadine (Allegra) and levocetirizine (Xyzal).  If allergen avoidance measures and medications fail to adequately relieve symptoms, aeroallergen immunotherapy will be considered.

## 2018-12-13 NOTE — Assessment & Plan Note (Signed)
   Continue albuterol HFA, 1 to 2 inhalations every 4-6 hours if needed.  Subjective and objective measures of pulmonary function will be followed and the treatment plan will be adjusted accordingly. 

## 2018-12-13 NOTE — Assessment & Plan Note (Signed)
   Treatment plan as outlined above for allergic rhinitis.  Continue olopatadine eyedrops, 1 drop per eye daily if needed. 

## 2019-01-10 ENCOUNTER — Encounter: Payer: Self-pay | Admitting: Family Medicine

## 2019-04-18 ENCOUNTER — Ambulatory Visit: Payer: 59 | Admitting: Allergy and Immunology

## 2019-05-30 ENCOUNTER — Ambulatory Visit (INDEPENDENT_AMBULATORY_CARE_PROVIDER_SITE_OTHER): Payer: 59 | Admitting: Allergy and Immunology

## 2019-05-30 ENCOUNTER — Other Ambulatory Visit: Payer: Self-pay

## 2019-05-30 ENCOUNTER — Encounter: Payer: Self-pay | Admitting: Allergy and Immunology

## 2019-05-30 DIAGNOSIS — J3089 Other allergic rhinitis: Secondary | ICD-10-CM

## 2019-05-30 DIAGNOSIS — J4530 Mild persistent asthma, uncomplicated: Secondary | ICD-10-CM

## 2019-05-30 DIAGNOSIS — H1013 Acute atopic conjunctivitis, bilateral: Secondary | ICD-10-CM | POA: Diagnosis not present

## 2019-05-30 MED ORDER — FLOVENT HFA 110 MCG/ACT IN AERO: 2.0000 | INHALATION_SPRAY | Freq: Two times a day (BID) | RESPIRATORY_TRACT | 5 refills | Status: DC

## 2019-05-30 MED ORDER — SPACER/AERO-HOLDING CHAMBERS DEVI: 1.0000 | Freq: Once | 0 refills | Status: DC | PRN

## 2019-05-30 MED ORDER — PAZEO 0.7 % OP SOLN: 1.0000 [drp] | OPHTHALMIC | 5 refills | Status: DC

## 2019-05-30 NOTE — Assessment & Plan Note (Addendum)
Currently with suboptimal control.  Start Xhance nasal spray, 1-2 sprays per nostril 2 times daily as needed.  If needed, add azelastine nasal spray, 1 to 2 sprays per nostril twice daily.  Nasal saline spray (i.e., Simply Saline) or nasal saline lavage (i.e., NeilMed) is recommended as needed and prior to medicated nasal sprays.  Continue levocetirizine (Xyzal), 5 mg daily as needed.  To avoid diminishing benefit with daily use (tachyphylaxis) of second generation antihistamine, consider alternating every few months between fexofenadine (Allegra) and levocetirizine (Xyzal).  If allergen avoidance measures and medications fail to adequately relieve symptoms, aeroallergen immunotherapy will be considered.

## 2019-05-30 NOTE — Progress Notes (Signed)
Follow-up Telemedicine Note  RE: Angela Harper MRN: [id number] DOB: Jul 02, 1975 Date of Telemedicine Visit: 05/30/2019  Primary care provider: Jeoffrey Massed, MD Referring provider: Jeoffrey Massed, MD  Telemedicine Follow Up Visit via Telephone: I connected with Caesar Chestnut for a follow up on 05/30/19 by telephone and verified that I am speaking with the correct person using two identifiers.   The limitations, risks, security and privacy concerns of performing an evaluation and management service by telemedicine, the availability of in person appointments, and that there may be a patient responsible charge related to this service were discussed. The patient expressed understanding and agreed to proceed.  Patient is at home.  Provider is at the office.  Visit start time: 4:10 PM Visit end time: 4:42 PM Insurance consent/check in by: Victorino Dike Medical consent and medical assistant/nurse: Ashleigh  History of present illness: Angela Harper is a 43 y.o. female with allergic rhinoconjunctivitis and asthma presenting today for follow-up via telemedicine visit.  She was last evaluated in this clinic, again via telemedicine, on December 13, 2018.  She reports that over the past few months she has been experiencing shortness of breath at nighttime, occasionally awakening her from sleep.  She reports that the dyspnea is more pronounced while lying on her back and seems to improve when she rolls to her side.  She had discontinued montelukast earlier this year, however is uncertain of the timing of the progression of dyspnea relative to when she discontinued the montelukast.  She has been told by her husband that she snores every night and occasionally makes "gurgling sounds" from her throat.  She has not asked him about apneic periods or resuscitative snorts.  She reports that she occasionally experiences nasal congestion which makes it difficult for her to fall to sleep at night.  She uses  fluticasone nasal spray occasionally and azelastine nasal spray as needed, but typically not on the same day.  Assessment and plan: Mild asthma  For now, and during respiratory tract infections or asthma flares, add Flovent 110g 2 inhalations 2 times per day until symptoms have returned to baseline.  A prescription has been provided.  To maximize pulmonary deposition, a spacer has been provided along with instructions for its proper administration with an HFA inhaler.  Continue albuterol HFA, 1 to 2 inhalations every 4-6 hours if needed.  Donne will ask her husband to monitor her sleep for apneic pauses and resuscitative snorts.  The patient has been asked to contact me if her symptoms persist or progress. Otherwise, she may return for follow up in 4 months.  Seasonal and perennial allergic rhinitis Currently with suboptimal control.  Start Xhance nasal spray, 1-2 sprays per nostril 2 times daily as needed.  If needed, add azelastine nasal spray, 1 to 2 sprays per nostril twice daily.  Nasal saline spray (i.e., Simply Saline) or nasal saline lavage (i.e., NeilMed) is recommended as needed and prior to medicated nasal sprays.  Continue levocetirizine (Xyzal), 5 mg daily as needed.  To avoid diminishing benefit with daily use (tachyphylaxis) of second generation antihistamine, consider alternating every few months between fexofenadine (Allegra) and levocetirizine (Xyzal).  If allergen avoidance measures and medications fail to adequately relieve symptoms, aeroallergen immunotherapy will be considered.  Allergic conjunctivitis  Treatment plan as outlined above for allergic rhinitis.  Continue olopatadine eyedrops, 1 drop per eye daily if needed.   Meds ordered this encounter  Medications   Olopatadine HCl (PAZEO) 0.7 % SOLN  Sig: Place 1 drop into both eyes 1 day or 1 dose.    Dispense:  2.5 mL    Refill:  5   fluticasone (FLOVENT HFA) 110 MCG/ACT inhaler    Sig: Inhale  2 puffs into the lungs 2 (two) times daily.    Dispense:  1 Inhaler    Refill:  5   Spacer/Aero-Holding Chambers DEVI    Sig: 1 Device by Does not apply route once as needed for up to 1 dose.    Dispense:  1 Device    Refill:  0    Diagnostics: None.  Physical examination: Physical Exam Not obtained as encounter was done via telephone.   The following portions of the patient's history were reviewed and updated as appropriate: allergies, current medications, past family history, past medical history, past social history, past surgical history and problem list.  Allergies as of 05/30/2019      Reactions   Codeine Nausea Only      Medication List       Accurate as of May 30, 2019  7:44 PM. If you have any questions, ask your nurse or doctor.        STOP taking these medications   cetirizine 10 MG tablet Commonly known as: ZYRTEC Stopped by: Wellington Hampshire Carter Gearline Spilman, MD     TAKE these medications   albuterol 108 (90 Base) MCG/ACT inhaler Commonly known as: VENTOLIN HFA INHALE 2 PUFFS INTO THE LUNGS EVERY 4 (FOUR) HOURS AS NEEDED FOR WHEEZING OR SHORTNESS OF BREATH.   Azelastine HCl 0.15 % Soln Place 1-2 sprays into both nostrils 2 (two) times daily as needed.   Flovent HFA 110 MCG/ACT inhaler Generic drug: fluticasone Inhale 2 puffs into the lungs 2 (two) times daily. Started by: Wellington Hampshire Carter Baylor Cortez, MD   fluticasone 50 MCG/ACT nasal spray Commonly known as: FLONASE Place 2 sprays into both nostrils daily. What changed: Another medication with the same name was removed. Continue taking this medication, and follow the directions you see here. Changed by: Wellington Hampshire Carter Hetvi Shawhan, MD   levocetirizine 5 MG tablet Commonly known as: XYZAL Take 1 tablet (5 mg total) by mouth every evening.   Lo Loestrin Fe 1 MG-10 MCG / 10 MCG tablet Generic drug: Norethindrone-Ethinyl Estradiol-Fe Biphas Take 1 tablet by mouth daily.   multivitamin tablet Take 1 tablet by mouth daily.     Pazeo 0.7 % Soln Generic drug: Olopatadine HCl Place 1 drop into both eyes 1 day or 1 dose.   PROBIOTIC PO Take 1 capsule by mouth daily.   Spacer/Aero-Holding Rudean Curthambers Devi 1 Device by Does not apply route once as needed for up to 1 dose. Started by: Wellington Hampshire Carter Emalea Mix, MD       Allergies  Allergen Reactions   Codeine Nausea Only   Review of systems: Review of systems negative except as noted in HPI / PMHx or noted below: Constitutional: Negative.  HENT: Negative.   Eyes: Negative.  Respiratory: Negative.   Cardiovascular: Negative.  Gastrointestinal: Negative.  Genitourinary: Negative.  Musculoskeletal: Negative.  Neurological: Negative.  Endo/Heme/Allergies: Negative.  Cutaneous: Negative.  Past Medical History:  Diagnosis Date   Allergic rhinoconjunctivitis    Asthma    mild persistent   Conductive hearing loss, bilateral    Chronic R TM perf.  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 Lazarus SalinesWolicki.(08/2018)   Depression    Perforation of right tympanic membrane    Chronic; tympanoplasty recommended 3/2020_>pt considering   UTI (urinary  tract infection)     Family History  Problem Relation Age of Onset   Hearing loss Father    Hypertension Father     Social History   Socioeconomic History   Marital status: Married    Spouse name: Not on file   Number of children: Not on file   Years of education: Not on file   Highest education level: Not on file  Occupational History   Not on file  Social Needs   Financial resource strain: Not on file   Food insecurity    Worry: Not on file    Inability: Not on file   Transportation needs    Medical: Not on file    Non-medical: Not on file  Tobacco Use   Smoking status: Never Smoker   Smokeless tobacco: Never Used  Substance and Sexual Activity   Alcohol use: Yes    Frequency: Never    Comment: Rare   Drug use: Never   Sexual activity: Not on file   Lifestyle   Physical activity    Days per week: Not on file    Minutes per session: Not on file   Stress: Not on file  Relationships   Social connections    Talks on phone: Not on file    Gets together: Not on file    Attends religious service: Not on file    Active member of club or organization: Not on file    Attends meetings of clubs or organizations: Not on file    Relationship status: Not on file   Intimate partner violence    Fear of current or ex partner: Not on file    Emotionally abused: Not on file    Physically abused: Not on file    Forced sexual activity: Not on file  Other Topics Concern   Not on file  Social History Narrative   Not on file    Previous notes and tests were reviewed.  I discussed the assessment and treatment plan with the patient. The patient was provided an opportunity to ask questions and all were answered. The patient agreed with the plan and demonstrated an understanding of the instructions.   The patient was advised to call back or seek an in-person evaluation if the symptoms worsen or if the condition fails to improve as anticipated.  I provided 25 minutes of non-face-to-face time during this encounter.  I appreciate the opportunity to take part in [facility] care. Please do not hesitate to contact me with questions.  Sincerely,   R. Edgar Frisk, MD

## 2019-05-30 NOTE — Assessment & Plan Note (Addendum)
   For now, and during respiratory tract infections or asthma flares, add Flovent 110g 2 inhalations 2 times per day until symptoms have returned to baseline.  A prescription has been provided.  To maximize pulmonary deposition, a spacer has been provided along with instructions for its proper administration with an HFA inhaler.  Continue albuterol HFA, 1 to 2 inhalations every 4-6 hours if needed.  Angela Harper will ask her husband to monitor her sleep for apneic pauses and resuscitative snorts.  The patient has been asked to contact me if her symptoms persist or progress. Otherwise, she may return for follow up in 4 months.

## 2019-05-30 NOTE — Assessment & Plan Note (Signed)
   Treatment plan as outlined above for allergic rhinitis.  Continue olopatadine eyedrops, 1 drop per eye daily if needed. 

## 2019-05-30 NOTE — Patient Instructions (Signed)
Mild asthma  For now, and during respiratory tract infections or asthma flares, add Flovent 110g 2 inhalations 2 times per day until symptoms have returned to baseline.  A prescription has been provided.  To maximize pulmonary deposition, a spacer has been provided along with instructions for its proper administration with an HFA inhaler.  Continue albuterol HFA, 1 to 2 inhalations every 4-6 hours if needed.  Angela Harper will ask her husband to monitor her sleep for apneic pauses and resuscitative snorts.  The patient has been asked to contact me if her symptoms persist or progress. Otherwise, she may return for follow up in 4 months.  Seasonal and perennial allergic rhinitis Currently with suboptimal control.  Start Xhance nasal spray, 1-2 sprays per nostril 2 times daily as needed.  If needed, add azelastine nasal spray, 1 to 2 sprays per nostril twice daily.  Nasal saline spray (i.e., Simply Saline) or nasal saline lavage (i.e., NeilMed) is recommended as needed and prior to medicated nasal sprays.  Continue levocetirizine (Xyzal), 5 mg daily as needed.  To avoid diminishing benefit with daily use (tachyphylaxis) of second generation antihistamine, consider alternating every few months between fexofenadine (Allegra) and levocetirizine (Xyzal).  If allergen avoidance measures and medications fail to adequately relieve symptoms, aeroallergen immunotherapy will be considered.  Allergic conjunctivitis  Treatment plan as outlined above for allergic rhinitis.  Continue olopatadine eyedrops, 1 drop per eye daily if needed.   Return in about 4 months (around 09/28/2019), or if symptoms worsen or fail to improve.

## 2019-05-31 ENCOUNTER — Telehealth: Payer: Self-pay | Admitting: Allergy and Immunology

## 2019-05-31 NOTE — Telephone Encounter (Signed)
Yes, please check alternatives on the patient's insurance and let me know what the options are.

## 2019-05-31 NOTE — Telephone Encounter (Signed)
Dr Bobbitt, please advise:   

## 2019-05-31 NOTE — Telephone Encounter (Signed)
Patient called stating that Flovent with spacer and Pazeo eye drops were over $300 all together, and patient would like to know if there is something else the doctor could prescribe.   Uses [facility] in [place].  Please advise.

## 2019-06-01 ENCOUNTER — Other Ambulatory Visit: Payer: Self-pay

## 2019-06-01 MED ORDER — OLOPATADINE HCL 0.1 % OP SOLN: 1.0000 [drp] | Freq: Two times a day (BID) | OPHTHALMIC | 5 refills | Status: DC

## 2019-06-01 NOTE — Telephone Encounter (Signed)
Patanol was sent to pharmacy

## 2019-06-01 NOTE — Telephone Encounter (Signed)
Patanol 0.1% is covered under formulary.

## 2019-06-01 NOTE — Telephone Encounter (Signed)
Patanol, 1 gtt ou bid prn. Thanks.

## 2019-06-02 ENCOUNTER — Other Ambulatory Visit: Payer: Self-pay

## 2019-06-02 NOTE — Telephone Encounter (Signed)
Her Flovent is also not covered. It looks like Pulmicort, Spiriva , Breo and Arnuity are covered. Please advise.

## 2019-06-05 ENCOUNTER — Other Ambulatory Visit: Payer: Self-pay

## 2019-06-05 MED ORDER — ARNUITY ELLIPTA 200 MCG/ACT IN AEPB: 1.0000 | INHALATION_SPRAY | Freq: Every day | RESPIRATORY_TRACT | 2 refills | Status: DC

## 2019-06-05 NOTE — Telephone Encounter (Signed)
Patient made aware of medication change per Dr. Verlin Fester. Patient verbalized understanding.

## 2019-06-05 NOTE — Telephone Encounter (Signed)
Arnuity 200, 1 puff daily for 2 weeks during asthma flares and respiratory tract infections.

## 2019-06-13 ENCOUNTER — Telehealth: Payer: Self-pay | Admitting: *Deleted

## 2019-06-13 ENCOUNTER — Other Ambulatory Visit: Payer: Self-pay | Admitting: *Deleted

## 2019-06-13 MED ORDER — QVAR REDIHALER 80 MCG/ACT IN AERB: 2.0000 | INHALATION_SPRAY | Freq: Two times a day (BID) | RESPIRATORY_TRACT | 5 refills | Status: DC

## 2019-06-13 NOTE — Telephone Encounter (Signed)
Qvar 80, 2 puffs bid. Thanks.

## 2019-06-13 NOTE — Telephone Encounter (Signed)
Patient called stating that the Flovent and Arnuity are both $150-$200 with her insurance. She called the insurance company and they were not helpful with what is covered on her formulary. If you find suitable I can send in Symbicort or Qvar and attach a Co-pay card to help make it more affordable. Please advise.

## 2019-06-13 NOTE — Telephone Encounter (Signed)
Prescription has been sent in. Called patient and informed. Patient verbalized understanding.  

## 2019-06-25 ENCOUNTER — Encounter: Payer: Self-pay | Admitting: Family Medicine

## 2019-10-31 ENCOUNTER — Other Ambulatory Visit: Payer: Self-pay

## 2019-10-31 ENCOUNTER — Ambulatory Visit (INDEPENDENT_AMBULATORY_CARE_PROVIDER_SITE_OTHER): Payer: 59 | Admitting: Allergy and Immunology

## 2019-10-31 ENCOUNTER — Encounter: Payer: Self-pay | Admitting: Allergy and Immunology

## 2019-10-31 VITALS — BP 122/78 | HR 74 | Temp 97.2°F | Resp 17 | Ht 64.0 in | Wt 147.4 lb

## 2019-10-31 DIAGNOSIS — H1013 Acute atopic conjunctivitis, bilateral: Secondary | ICD-10-CM | POA: Diagnosis not present

## 2019-10-31 DIAGNOSIS — J4530 Mild persistent asthma, uncomplicated: Secondary | ICD-10-CM | POA: Diagnosis not present

## 2019-10-31 DIAGNOSIS — J3089 Other allergic rhinitis: Secondary | ICD-10-CM

## 2019-10-31 MED ORDER — XHANCE 93 MCG/ACT NA EXHU: 1.0000 | INHALANT_SUSPENSION | Freq: Two times a day (BID) | NASAL | 5 refills | Status: DC | PRN

## 2019-10-31 NOTE — Progress Notes (Signed)
Follow-up Note  RE: Angela Harper MRN: [id number] DOB: [birth year] Date of Office Visit: 10/31/2019  Primary care provider: Jeoffrey Massed, MD Referring provider: Jeoffrey Massed, MD  History of present illness: Angela Harper is a 44 y.o. female with allergic rhinoconjunctivitis and asthma presenting today for follow up.  She was last seen in this clinic in December 2020.  She reports that 2 or 3 nights per week she wakes up from sleep gasping for air, particularly if she has been lying on her back.  Shortly after waking up she has "difficulty getting a deep breath" until she rolls over from her back to her side.  She occasionally experiences some dyspnea if she is rushing around at work.  She has not used any inhalers in the interval since her previous visit.  She is uncertain if her symptoms are due to asthma or some other etiology.  She does believe that when her nose becomes congested it causes her to feel short of breath, particularly when having wear a mask.  She has been experiencing nasal congestion despite using Flonase and azelastine nasal spray.  Assessment and plan: Dyspnea Unclear etiology.  Asthma versus sighing dyspnea versus sleep apnea versus multifactorial.  We will proceed with a therapeutic trial of inhaled corticosteroid/long-acting bronchodilator.  Samples have been provided for Air Duo 117mcg/14 mcg.  The patient is to take 1 inhalation every 12 hours and assess for symptom reduction.  Continue albuterol HFA, 1 to 2 inhalations every 4-6 hours if needed.  If the patient is unable to discern any change in symptoms then further evaluation by a pulmonologist may be warranted.  Split-night polysomnography may be helpful.  Seasonal and perennial allergic rhinitis Currently with suboptimal control.  A prescription has been provided for Xhance nasal spray, 1-2 sprays per nostril 2 times daily as needed.  If needed, add azelastine nasal spray, 1 to 2 sprays  per nostril twice daily.  Nasal saline spray (i.e., Simply Saline) or nasal saline lavage (i.e., NeilMed) is recommended as needed and prior to medicated nasal sprays.  Continue levocetirizine (Xyzal), 5 mg daily as needed.  To avoid diminishing benefit with daily use (tachyphylaxis) of second generation antihistamine, consider alternating every few months between fexofenadine (Allegra) and levocetirizine (Xyzal).  If allergen avoidance measures and medications fail to adequately relieve symptoms, aeroallergen immunotherapy will be considered.  Allergic conjunctivitis  Treatment plan as outlined above for allergic rhinitis.  Pataday, one drop per eye daily as needed.   I have also recommended eye lubricant drops (i.e., Natural Tears) as needed.   Meds ordered this encounter  Medications  . Fluticasone Propionate (XHANCE) 93 MCG/ACT EXHU    Sig: Place 1-2 sprays into the nose 2 (two) times daily as needed.    Dispense:  32 mL    Refill:  5    [id number] (M)    Diagnostics: Spirometry:  Normal with an FEV1 of 101% predicted. This study was performed while the patient was asymptomatic.  Please see scanned spirometry results for details.    Physical examination: Blood pressure 122/78, pulse 74, temperature (!) 97.2 F (36.2 C), temperature source Temporal, resp. rate 17, height 5\' 4"  (1.626 m), weight 147 lb 6.4 oz (66.9 kg), SpO2 98 %.  General: Alert, interactive, in no acute distress. HEENT: TMs pearly gray, turbinates moderately edematous without discharge, post-pharynx moderately erythematous. Neck: Supple without lymphadenopathy. Lungs: Clear to auscultation without wheezing, rhonchi or rales. CV: Normal S1, S2 without murmurs. Skin: Warm  and dry, without lesions or rashes.  The following portions of the patient's history were reviewed and updated as appropriate: allergies, current medications, past family history, past medical history, past social history, past  surgical history and problem list.  Current Outpatient Medications  Medication Sig Dispense Refill  . albuterol (PROVENTIL HFA;VENTOLIN HFA) 108 (90 Base) MCG/ACT inhaler INHALE 2 PUFFS INTO THE LUNGS EVERY 4 (FOUR) HOURS AS NEEDED FOR WHEEZING OR SHORTNESS OF BREATH. 18 Inhaler 1  . Azelastine HCl 0.15 % SOLN Place 1-2 sprays into both nostrils 2 (two) times daily as needed. 30 mL 5  . beclomethasone (QVAR REDIHALER) 80 MCG/ACT inhaler Inhale 2 puffs into the lungs 2 (two) times daily. 10.6 g 5  . fluticasone (FLOVENT HFA) 110 MCG/ACT inhaler Inhale 2 puffs into the lungs 2 (two) times daily. 1 Inhaler 5  . Fluticasone Furoate (ARNUITY ELLIPTA) 200 MCG/ACT AEPB Inhale 1 puff into the lungs daily. 30 each 2  . levocetirizine (XYZAL) 5 MG tablet Take 1 tablet (5 mg total) by mouth every evening. 30 tablet 5  . LO LOESTRIN FE 1 MG-10 MCG / 10 MCG tablet Take 1 tablet by mouth daily.  2  . Multiple Vitamin (MULTIVITAMIN) tablet Take 1 tablet by mouth daily.    Marland Kitchen olopatadine (PATANOL) 0.1 % ophthalmic solution Place 1 drop into both eyes 2 (two) times daily. As needed. 5 mL 5  . Probiotic Product (PROBIOTIC PO) Take 1 capsule by mouth daily.    Marland Kitchen Spacer/Aero-Holding Chambers DEVI 1 Device by Does not apply route once as needed for up to 1 dose. 1 Device 0  . Fluticasone Propionate (XHANCE) 93 MCG/ACT EXHU Place 1-2 sprays into the nose 2 (two) times daily as needed. 32 mL 5   No current facility-administered medications for this visit.    Allergies  Allergen Reactions  . Codeine Nausea Only   Review of systems: Review of systems negative except as noted in HPI / PMHx.  Past Medical History:  Diagnosis Date  . Allergic rhinoconjunctivitis   . Asthma    mild persistent->flovent for worsening control/flares, +prn albut  . Conductive hearing loss, bilateral    Chronic R TM perf.  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 Lazarus Salines.(08/2018)  . Depression   .  Perforation of right tympanic membrane    Chronic; tympanoplasty recommended 3/2020_>pt considering  . UTI (urinary tract infection)     Family History  Problem Relation Age of Onset  . Hearing loss Father   . Hypertension Father     Social History   Socioeconomic History  . Marital status: Married    Spouse name: Not on file  . Number of children: Not on file  . Years of education: Not on file  . Highest education level: Not on file  Occupational History  . Not on file  Tobacco Use  . Smoking status: Never Smoker  . Smokeless tobacco: Never Used  Substance and Sexual Activity  . Alcohol use: Yes    Comment: Rare  . Drug use: Never  . Sexual activity: Yes  Other Topics Concern  . Not on file  Social History Narrative  . Not on file   Social Determinants of Health   Financial Resource Strain:   . Difficulty of Paying Living Expenses:   Food Insecurity:   . Worried About Programme researcher, broadcasting/film/video in the Last Year:   . Barista in the Last Year:   Transportation Needs:   .  Lack of Transportation (Medical):   Marland Kitchen Lack of Transportation (Non-Medical):   Physical Activity:   . Days of Exercise per Week:   . Minutes of Exercise per Session:   Stress:   . Feeling of Stress :   Social Connections:   . Frequency of Communication with Friends and Family:   . Frequency of Social Gatherings with Friends and Family:   . Attends Religious Services:   . Active Member of Clubs or Organizations:   . Attends Archivist Meetings:   Marland Kitchen Marital Status:   Intimate Partner Violence:   . Fear of Current or Ex-Partner:   . Emotionally Abused:   Marland Kitchen Physically Abused:   . Sexually Abused:     I appreciate the opportunity to take part in [facility] care. Please do not hesitate to contact me with questions.  Sincerely,   R. Edgar Frisk, MD

## 2019-10-31 NOTE — Assessment & Plan Note (Signed)
Currently with suboptimal control.  A prescription has been provided for Xhance nasal spray, 1-2 sprays per nostril 2 times daily as needed.  If needed, add azelastine nasal spray, 1 to 2 sprays per nostril twice daily.  Nasal saline spray (i.e., Simply Saline) or nasal saline lavage (i.e., NeilMed) is recommended as needed and prior to medicated nasal sprays.  Continue levocetirizine (Xyzal), 5 mg daily as needed.  To avoid diminishing benefit with daily use (tachyphylaxis) of second generation antihistamine, consider alternating every few months between fexofenadine (Allegra) and levocetirizine (Xyzal).  If allergen avoidance measures and medications fail to adequately relieve symptoms, aeroallergen immunotherapy will be considered.

## 2019-10-31 NOTE — Assessment & Plan Note (Signed)
   Treatment plan as outlined above for allergic rhinitis.  Pataday, one drop per eye daily as needed.   I have also recommended eye lubricant drops (i.e., Natural Tears) as needed.

## 2019-10-31 NOTE — Patient Instructions (Addendum)
Dyspnea Unclear etiology.  Asthma versus sighing dyspnea versus sleep apnea versus multifactorial.  We will proceed with a therapeutic trial of inhaled corticosteroid/long-acting bronchodilator.  Samples have been provided for Air Duo 124mcg/14 mcg.  The patient is to take 1 inhalation every 12 hours and assess for symptom reduction.  Continue albuterol HFA, 1 to 2 inhalations every 4-6 hours if needed.  If the patient is unable to discern any change in symptoms then further evaluation by a pulmonologist may be warranted.  Split-night polysomnography may be helpful.  Seasonal and perennial allergic rhinitis Currently with suboptimal control.  A prescription has been provided for Xhance nasal spray, 1-2 sprays per nostril 2 times daily as needed.  If needed, add azelastine nasal spray, 1 to 2 sprays per nostril twice daily.  Nasal saline spray (i.e., Simply Saline) or nasal saline lavage (i.e., NeilMed) is recommended as needed and prior to medicated nasal sprays.  Continue levocetirizine (Xyzal), 5 mg daily as needed.  To avoid diminishing benefit with daily use (tachyphylaxis) of second generation antihistamine, consider alternating every few months between fexofenadine (Allegra) and levocetirizine (Xyzal).  If allergen avoidance measures and medications fail to adequately relieve symptoms, aeroallergen immunotherapy will be considered.  Allergic conjunctivitis  Treatment plan as outlined above for allergic rhinitis.  Pataday, one drop per eye daily as needed.   I have also recommended eye lubricant drops (i.e., Natural Tears) as needed.   Return in about 3 months (around 01/31/2020), or if symptoms worsen or fail to improve.

## 2019-10-31 NOTE — Assessment & Plan Note (Signed)
Unclear etiology.  Asthma versus sighing dyspnea versus sleep apnea versus multifactorial.  We will proceed with a therapeutic trial of inhaled corticosteroid/long-acting bronchodilator.  Samples have been provided for Air Duo 12mcg/14 mcg.  The patient is to take 1 inhalation every 12 hours and assess for symptom reduction.  Continue albuterol HFA, 1 to 2 inhalations every 4-6 hours if needed.  If the patient is unable to discern any change in symptoms then further evaluation by a pulmonologist may be warranted.  Split-night polysomnography may be helpful.

## 2019-11-26 ENCOUNTER — Other Ambulatory Visit: Payer: Self-pay | Admitting: Allergy and Immunology

## 2020-02-01 ENCOUNTER — Ambulatory Visit: Payer: 59 | Admitting: Allergy

## 2020-04-08 ENCOUNTER — Other Ambulatory Visit: Payer: Self-pay

## 2020-04-08 ENCOUNTER — Ambulatory Visit (INDEPENDENT_AMBULATORY_CARE_PROVIDER_SITE_OTHER): Payer: BLUE CROSS/BLUE SHIELD | Admitting: Family Medicine

## 2020-04-08 ENCOUNTER — Encounter: Payer: Self-pay | Admitting: Family Medicine

## 2020-04-08 VITALS — BP 131/83 | HR 83 | Temp 97.3°F | Resp 16 | Wt 145.6 lb

## 2020-04-08 DIAGNOSIS — R0789 Other chest pain: Secondary | ICD-10-CM

## 2020-04-08 DIAGNOSIS — R002 Palpitations: Secondary | ICD-10-CM

## 2020-04-08 NOTE — Progress Notes (Signed)
OFFICE VISIT  04/08/2020  CC:  Chief Complaint  Patient presents with  . Chest Pain    flutters   HPI:    Patient is a 44 y.o. Caucasian female who presents for "chest tightness, flutters".  After 2nd covid vaccine 7 mo ago she had chest tightness, body achy and stiff, excessive sweating---all in the middle of the night.  Went back to sleep, woke up better. More chest tightness since that time, noted more at night.  Feels just a bit sob intermittently, esp lying on back at night, also with moving around.  Also a couple of periods recently---in last 1-2 wks--in which she feels brief spurt of rapid or "fluttering" heartbeat, lasts a few seconds.  Occ orthostatic dizziness. No actual chest pain. No nausea or diaphoresis.  No wheezing.  Occ use of albut inhaler in middle of the night, similar to BEFORE the got the vaccine.   No swelling or pain in legs.  She came in b/c the chest tightness feeling more persistent and stronger.  Heart fluttering sensation has only occurred a few times over last couple weeks.  Definitely more stress in life lately. Has been on loestrin ocps long term.  She does not smoke. Long hx of intermittent periods of neck pain, alternating arm numbness when lying on her side in bed.   Past Medical History:  Diagnosis Date  . Allergic rhinoconjunctivitis   . Asthma    mild persistent->flovent for worsening control/flares, +prn albut. Spirometry normal 10/2019 (Dr. Rod Can)  . Conductive hearing loss, bilateral    Chronic R TM perf.  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 Lazarus Salines.(08/2018)  . Depression   . Perforation of right tympanic membrane    Chronic; tympanoplasty recommended 3/2020_>pt considering  . UTI (urinary tract infection)     Past Surgical History:  Procedure Laterality Date  . CESAREAN SECTION    . 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  Has had R side soft palate atrophy since.  . TONSILLECTOMY     child  . TONSILLECTOMY    . TYMPANOSTOMY TUBE PLACEMENT Bilateral    child    Outpatient Medications Prior to Visit  Medication Sig Dispense Refill  . albuterol (PROVENTIL HFA;VENTOLIN HFA) 108 (90 Base) MCG/ACT inhaler INHALE 2 PUFFS INTO THE LUNGS EVERY 4 (FOUR) HOURS AS NEEDED FOR WHEEZING OR SHORTNESS OF BREATH. 18 Inhaler 1  . Azelastine HCl 0.15 % SOLN Place 1-2 sprays into both nostrils 2 (two) times daily as needed. 30 mL 5  . beclomethasone (QVAR REDIHALER) 80 MCG/ACT inhaler Inhale 2 puffs into the lungs 2 (two) times daily. 10.6 g 5  . levocetirizine (XYZAL) 5 MG tablet TAKE 1 TABLET BY MOUTH EVERY DAY IN THE EVENING 30 tablet 5  . LO LOESTRIN FE 1 MG-10 MCG / 10 MCG tablet Take 1 tablet by mouth daily.  2  . Multiple Vitamin (MULTIVITAMIN) tablet Take 1 tablet by mouth daily.    . Probiotic Product (PROBIOTIC PO) Take 1 capsule by mouth daily.    Marland Kitchen Spacer/Aero-Holding Chambers DEVI 1 Device by Does not apply route once as needed for up to 1 dose. 1 Device 0  . fluticasone (FLOVENT HFA) 110 MCG/ACT inhaler Inhale 2 puffs into the lungs 2 (two) times daily. (Patient not taking: Reported on 04/08/2020) 1 Inhaler 5  . Fluticasone Furoate (ARNUITY ELLIPTA) 200 MCG/ACT AEPB Inhale 1 puff into the  lungs daily. (Patient not taking: Reported on 04/08/2020) 30 each 2  . Fluticasone Propionate (XHANCE) 93 MCG/ACT EXHU Place 1-2 sprays into the nose 2 (two) times daily as needed. (Patient not taking: Reported on 04/08/2020) 32 mL 5  . olopatadine (PATANOL) 0.1 % ophthalmic solution Place 1 drop into both eyes 2 (two) times daily. As needed. (Patient not taking: Reported on 04/08/2020) 5 mL 5   No facility-administered medications prior to visit.    Allergies  Allergen Reactions  . Codeine Nausea Only    ROS As per HPI  PE: Vitals with BMI 04/08/2020 10/31/2019 06/21/2018  Height - 5\' 4"  -  Weight 145 lbs 10 oz  147 lbs 6 oz -  BMI - 25.29 -  Systolic 131 122  Diastolic 83 78 82  Pulse 83 74 -     Gen: Alert, well appearing.  Patient is oriented to person, place, time, and situation. AFFECT: pleasant, lucid thought and speech. ENT: Ears: R EAC with 95% cerumen obstruction.  L EAC with 60-70 % cerumen obst but I can view normal TM .Eyes: no injection, icteris, swelling, or exudate.  EOMI, PERRLA. Nose: no drainage or turbinate edema/swelling.  No injection or focal lesion.  Mouth: lips without lesion/swelling.  Oral mucosa pink and moist.  Dentition intact and without obvious caries or gingival swelling.  Oropharynx without erythema, exudate, or swelling.  Neck - No masses or thyromegaly or limitation in range of motion CV: RRR, no m/r/g.   LUNGS: CTA bilat, nonlabored resps, good aeration in all lung fields. EXT: no clubbing or cyanosis.  no edema.  No tenderness of calves, no asymmetry of LL's.   LABS:  none  IMPRESSION AND PLAN:  Intermittent chest tightness sensation, rare/occasional episode of very brief heart fluttering. Lots of anxiety/stress, more lately than her usual. We discussed possible causes for her sx's and possible next steps/workup. I feel like reassurance is all that is needed at this time. I have very low suspicion of any cardiopulmonary etiology of her sx's, but if things get more severe or she starts to get new sx's, or simply wants to get checked out again I encouraged her to return. I feel the most likely explanation for her symptoms is side effect from covid 19 vaccine---sx's started only a few hours after injection #2 back in march this year.  I wrote her a letter for her employer today recommending that she NOT get a booster covid 19 vaccine injection.  An After Visit Summary was printed and given to the patient.  FOLLOW UP: Return if symptoms worsen or fail to improve.  Signed:  April, MD           04/08/2020

## 2020-04-22 ENCOUNTER — Other Ambulatory Visit: Payer: Self-pay

## 2020-04-24 ENCOUNTER — Other Ambulatory Visit: Payer: Self-pay | Admitting: *Deleted

## 2020-04-24 MED ORDER — XHANCE 93 MCG/ACT NA EXHU: 1.0000 | INHALANT_SUSPENSION | Freq: Two times a day (BID) | NASAL | 0 refills | Status: DC | PRN

## 2020-05-13 ENCOUNTER — Other Ambulatory Visit: Payer: Self-pay | Admitting: *Deleted

## 2020-05-14 ENCOUNTER — Other Ambulatory Visit: Payer: Self-pay | Admitting: Allergy and Immunology

## 2020-05-14 ENCOUNTER — Other Ambulatory Visit: Payer: Self-pay

## 2020-05-18 ENCOUNTER — Other Ambulatory Visit: Payer: Self-pay | Admitting: Family Medicine

## 2020-09-15 IMAGING — DX DG SINUSES COMPLETE 3+V
3 series · 3 of 3 positions shown · non-contrast
Comparison: None.

CLINICAL DATA: Allergic rhinitis with sinusitis

EXAM:
PARANASAL SINUSES - COMPLETE 3 + VIEW

[pns waters]
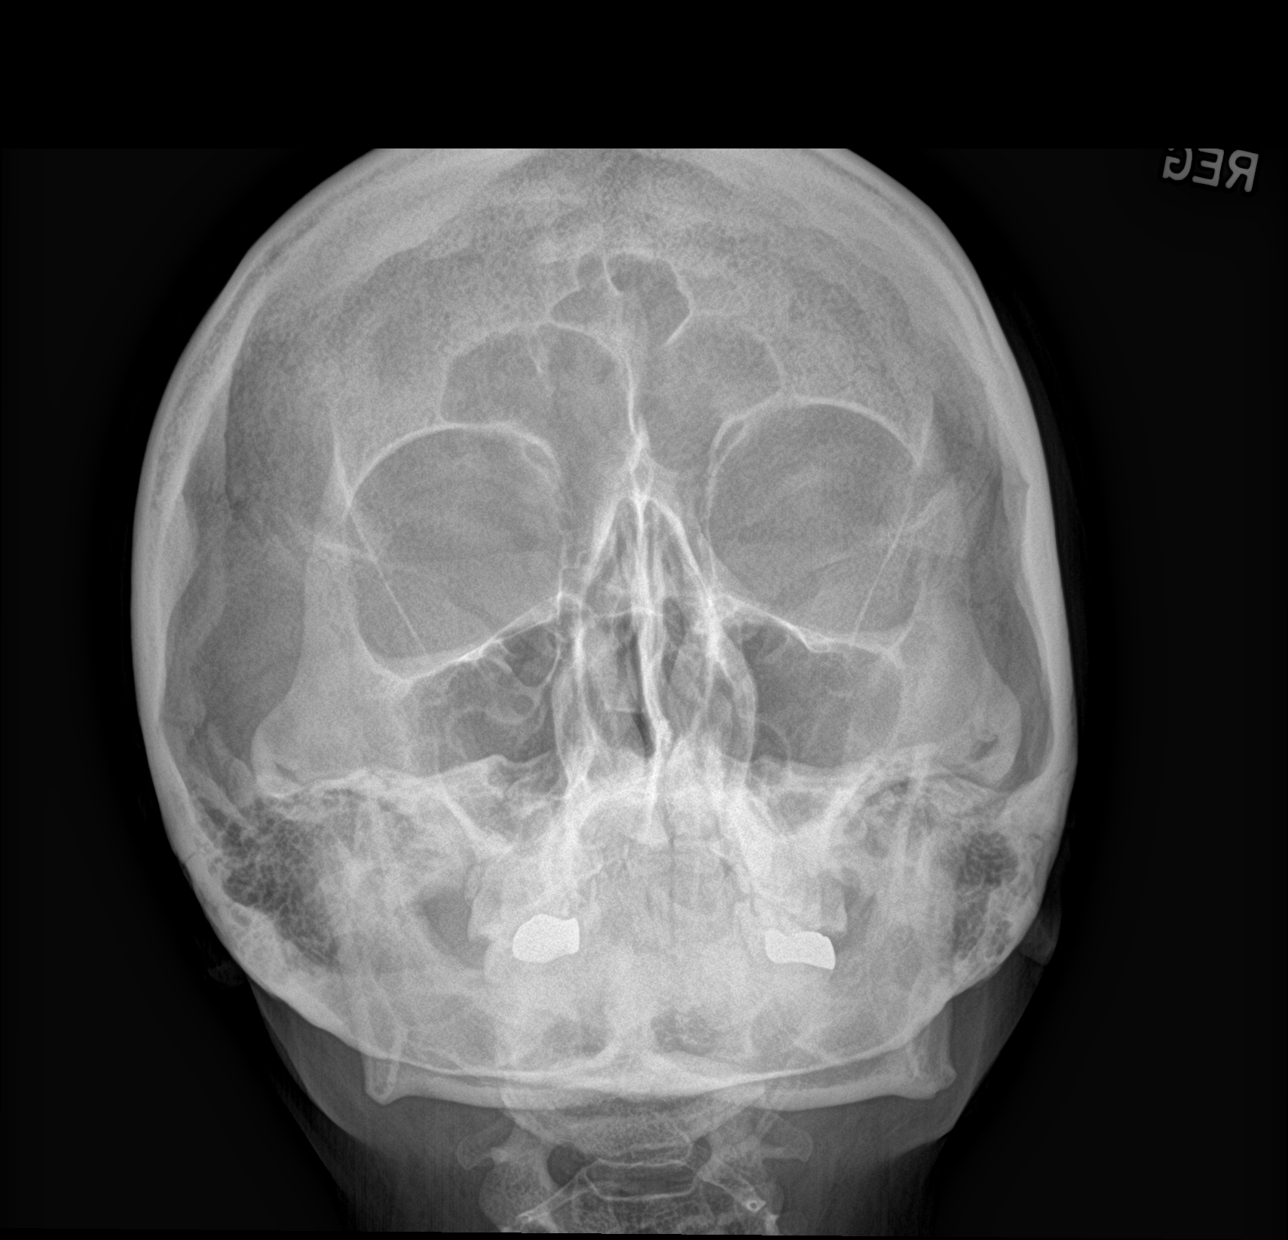

[[person_name]]
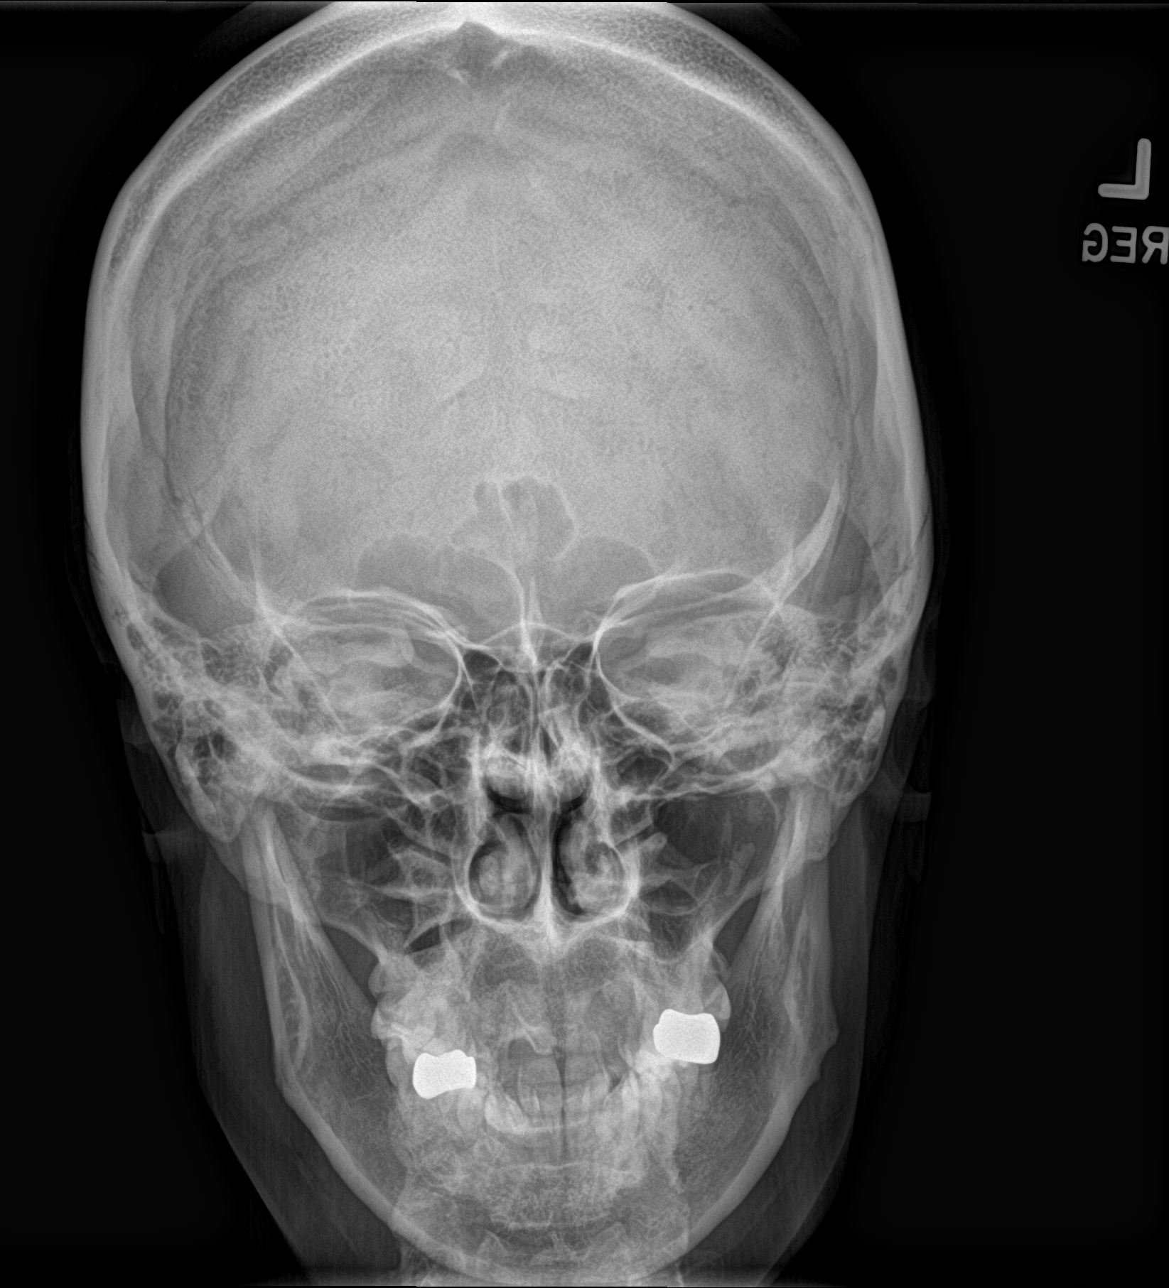

[pns lat]
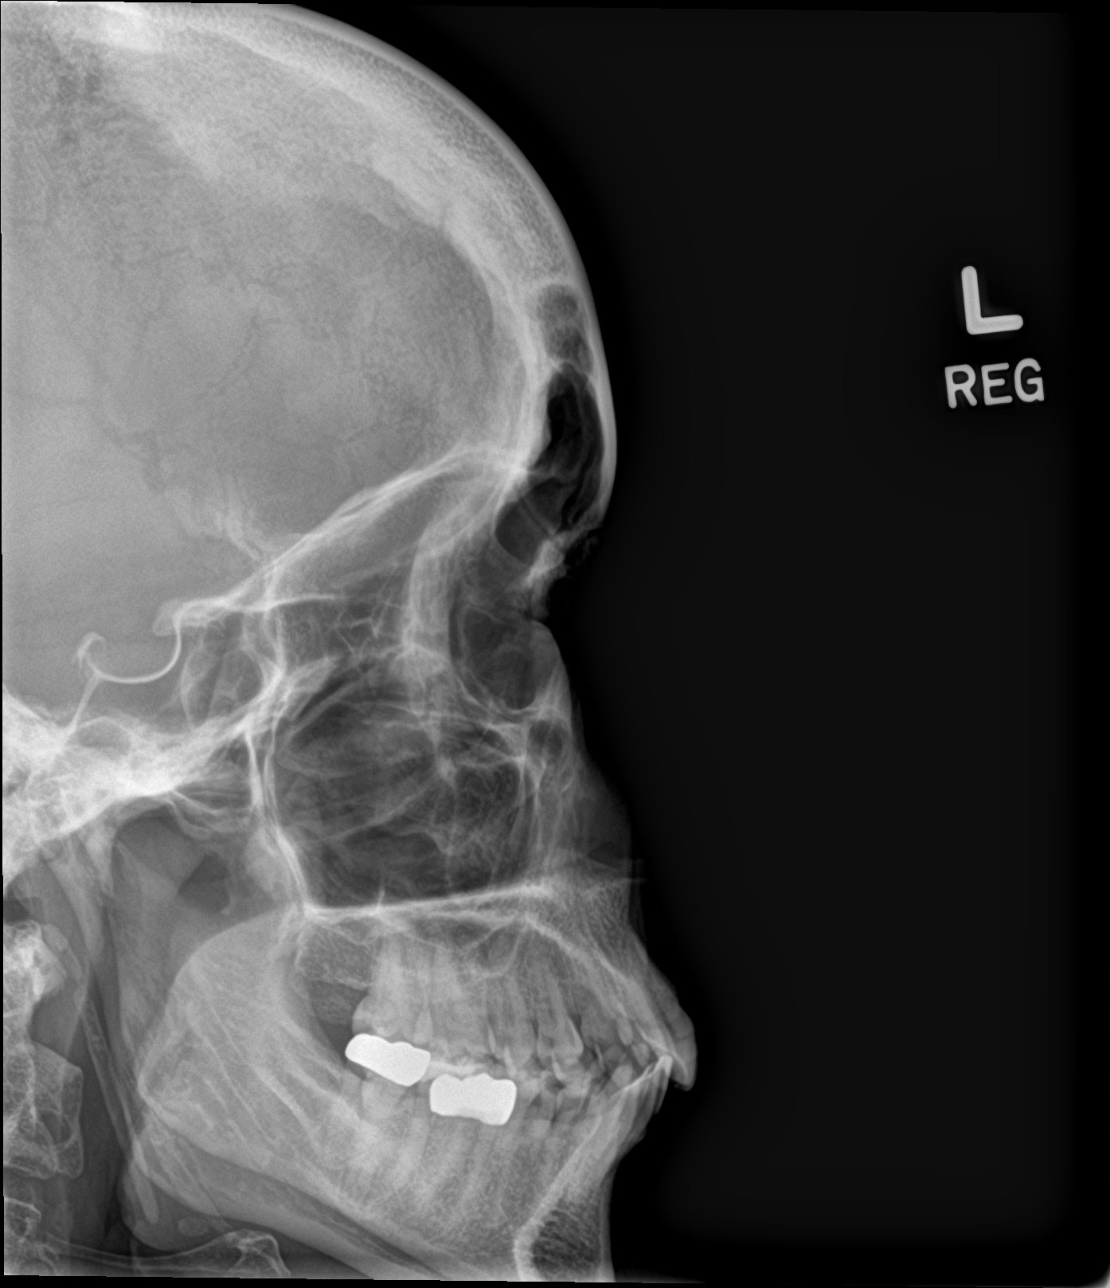

[3 of 3 positions shown; findings below may reference images not displayed]

FINDINGS: Ot, [place], and lateral views were obtained. Paranasal
sinuses are clear. No air-fluid level. No bony destruction or
expansion. Mastoid air cells are clear. There is mild leftward
deviation of the inferior nasal septum.
IMPRESSION: Paranasal sinuses and mastoids are clear. No bony abnormality beyond
mild leftward deviation of the nasal septum.

## 2020-11-29 ENCOUNTER — Other Ambulatory Visit: Payer: Self-pay

## 2021-01-23 ENCOUNTER — Encounter: Payer: Self-pay | Admitting: Allergy

## 2021-01-23 ENCOUNTER — Ambulatory Visit: Payer: BC Managed Care – PPO | Admitting: Allergy

## 2021-01-23 ENCOUNTER — Other Ambulatory Visit: Payer: Self-pay

## 2021-01-23 VITALS — BP 132/84 | HR 68 | Temp 97.2°F | Resp 18 | Ht 64.9 in | Wt 147.8 lb

## 2021-01-23 DIAGNOSIS — J302 Other seasonal allergic rhinitis: Secondary | ICD-10-CM | POA: Diagnosis not present

## 2021-01-23 DIAGNOSIS — R0602 Shortness of breath: Secondary | ICD-10-CM | POA: Diagnosis not present

## 2021-01-23 DIAGNOSIS — H7291 Unspecified perforation of tympanic membrane, right ear: Secondary | ICD-10-CM | POA: Diagnosis not present

## 2021-01-23 DIAGNOSIS — H1013 Acute atopic conjunctivitis, bilateral: Secondary | ICD-10-CM | POA: Diagnosis not present

## 2021-01-23 DIAGNOSIS — J3089 Other allergic rhinitis: Secondary | ICD-10-CM

## 2021-01-23 DIAGNOSIS — H1010 Acute atopic conjunctivitis, unspecified eye: Secondary | ICD-10-CM

## 2021-01-23 MED ORDER — MONTELUKAST SODIUM 10 MG PO TABS: 10.0000 mg | ORAL_TABLET | Freq: Every day | ORAL | 5 refills | Status: DC

## 2021-01-23 MED ORDER — ALBUTEROL SULFATE HFA 108 (90 BASE) MCG/ACT IN AERS: 2.0000 | INHALATION_SPRAY | RESPIRATORY_TRACT | 2 refills | Status: DC | PRN

## 2021-01-23 NOTE — Patient Instructions (Addendum)
Allergic rhinoconjunctivitis 2019 skin testing was positive to grass, ragweed, mold, cat, dog, cockroach and dust mite. Continue environmental control measures. Use over the counter antihistamines such as Zyrtec (cetirizine), Claritin (loratadine), Allegra (fexofenadine), or Xyzal (levocetirizine) daily as needed. May take twice a day during allergy flares. May switch antihistamines every few months. Start Singulair (montelukast) 10mg  daily at night. Cautioned that in some children/adults can experience behavioral changes including hyperactivity, agitation, depression, sleep disturbances and suicidal ideations. These side effects are rare, but if you notice them you should notify me and discontinue Singulair (montelukast).  Start Xhance (fluticasone) nasal spray 2 sprays per nostril twice a day as needed for nasal congestion.   May use refresh eye drops as needed.  Use azelastine nasal spray 1-2 sprays per nostril twice a day as needed for runny nose/drainage. Consider allergy injections for long term control if above medications do not help the symptoms - handout given.   Shortness of breath Normal breathing test today. May use albuterol rescue inhaler 2 puffs every 4 to 6 hours as needed for shortness of breath, chest tightness, coughing, and wheezing. Monitor frequency of use.   Follow up in 3 months or sooner if needed.

## 2021-01-23 NOTE — Assessment & Plan Note (Signed)
Recommend to follow-up with ENT.

## 2021-01-23 NOTE — Progress Notes (Signed)
Follow Up Note  RE: Angela Harper MRN: [id number] DOB: 1976-04-28 Date of Office Visit: 01/23/2021  Referring provider: Jeoffrey Massed, MD Primary care provider: Jeoffrey Massed, MD  Chief Complaint: Allergies (Still sneezing a lot, a lot of congestion, eyes feel dry and itchy puffiness, itchy nose, right ear seems to have fluid on it )  History of Present Illness: I had the pleasure of seeing Angela Harper for [facility]. She is a 45 y.o. female, who is being followed for dyspnea, allergic rhinoconjunctivitis. Her previous allergy office visit was on 10/31/2019 with Dr. Nunzio Cobbs. Today is a regular follow up visit.  Dyspnea Tried Airduo a few times with no benefit. Using albuterol as needed - only every few months. She noted this symptoms especially when laying down at night. The albuterol does seem to help.    Allergic rhino conjunctivitis Still having sneezing, nasal congestion, rhinorrhea, dry/itchy eyes which present perennially. Currently on Xyzal 5mg  daily with some benefit. Sometimes uses allegra as well.  Sometimes using saline spray and Xhance as needed only. No nosebleeds.  Using systane and visine eye drops as needed with some benefit. No recent eye drops.  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  Assessment and Plan: Angela Harper is a 45 y.o. female with: Seasonal and perennial allergic rhinoconjunctivitis Past history - 2019 skin testing was positive to grass, ragweed, mold, cat, dog, cockroach and dust mite. Interim history - not controlled and using Xyzal daily. Only using eye drops and nasal sprays prn. Continue environmental control measures. Use over the counter antihistamines such as Zyrtec (cetirizine), Claritin (loratadine), Allegra (fexofenadine), or Xyzal (levocetirizine) daily as needed. May take twice a day during allergy flares. May switch antihistamines every few  months. Start Singulair (montelukast) 10mg  daily at night. Cautioned that in some children/adults can experience behavioral changes including hyperactivity, agitation, depression, sleep disturbances and suicidal ideations. These side effects are rare, but if you notice them you should notify me and discontinue Singulair (montelukast). Start Xhance (fluticasone) nasal spray 2 sprays per nostril twice a day as needed for nasal congestion.  May use refresh eye drops as needed.  Use azelastine nasal spray 1-2 sprays per nostril twice a day as needed for runny nose/drainage. Consider allergy injections for long term control if above medications do not help the symptoms - handout given.   Shortness of breath Tried Airduo a few times with no change in symptoms. Notices it more when laying down and only needed to use albuterol a few times per year with good benefit. Today's spirometry was normal. May use albuterol rescue inhaler 2 puffs every 4 to 6 hours as needed for shortness of breath, chest tightness, coughing, and wheezing. Monitor frequency of use.   Perforated tympanic membrane, right Recommend to follow up with ENT.  Return in about 3 months (around 04/25/2021).  Meds ordered this encounter  Medications   montelukast (SINGULAIR) 10 MG tablet    Sig: Take 1 tablet (10 mg total) by mouth at bedtime.    Dispense:  30 tablet    Refill:  5   albuterol (VENTOLIN HFA) 108 (90 Base) MCG/ACT inhaler    Sig: Inhale 2 puffs into the lungs every 4 (four) hours as needed for wheezing or shortness of breath (coughing fits).    Dispense:  18 g    Refill:  2    Lab Orders  No laboratory test(s) ordered  today    Diagnostics: Spirometry:  Tracings reviewed. Her effort: Good reproducible efforts. FVC: 3.68L FEV1: 3.08L, 105% predicted FEV1/FVC ratio: 84% Interpretation: Spirometry consistent with normal pattern.  Please see scanned spirometry results for details.  Medication List:  Current  Outpatient Medications  Medication Sig Dispense Refill   albuterol (VENTOLIN HFA) 108 (90 Base) MCG/ACT inhaler Inhale 2 puffs into the lungs every 4 (four) hours as needed for wheezing or shortness of breath (coughing fits). 18 g 2   Azelastine HCl 0.15 % SOLN Place 1-2 sprays into both nostrils 2 (two) times daily as needed. 30 mL 5   Fluticasone Propionate (XHANCE) 93 MCG/ACT EXHU Place 1-2 sprays into the nose 2 (two) times daily as needed. 32 mL 0   LO LOESTRIN FE 1 MG-10 MCG / 10 MCG tablet Take 1 tablet by mouth daily.  2   montelukast (SINGULAIR) 10 MG tablet Take 1 tablet (10 mg total) by mouth at bedtime. 30 tablet 5   Multiple Vitamin (MULTIVITAMIN) tablet Take 1 tablet by mouth daily.     Probiotic Product (PROBIOTIC PO) Take 1 capsule by mouth daily.     No current facility-administered medications for this visit.   Allergies: Allergies  Allergen Reactions   Codeine Nausea Only   I reviewed her past medical history, social history, family history, and environmental history and no significant changes have been reported from her previous visit.  Review of Systems  Constitutional:  Negative for appetite change, chills, fever and unexpected weight change.  HENT:  Positive for congestion and rhinorrhea.   Eyes:  Positive for itching.  Respiratory:  Positive for shortness of breath. Negative for cough, chest tightness and wheezing.   Gastrointestinal:  Negative for abdominal pain.  Skin:  Negative for rash.  Allergic/Immunologic: Positive for environmental allergies.  Neurological:  Negative for headaches.   Objective: BP 132/84 (BP Location: Left Arm, Patient Position: Sitting, Cuff Size: Normal)   Pulse 68   Temp (!) 97.2 F (36.2 C) (Temporal)   Resp 18   Ht 5' 4.9" (1.648 m)   Wt 147 lb 12 oz (67 kg)   SpO2 99%   BMI 24.66 kg/m  Body mass index is 24.66 kg/m. Physical Exam Vitals and nursing note reviewed.  Constitutional:      Appearance: Normal appearance.  She is well-developed.  HENT:     Head: Normocephalic and atraumatic.     Right Ear: External ear normal.     Left Ear: External ear normal.     Ears:     Comments: Left ear - scarring of TM, right ear - perforated TM.    Nose: Nose normal.     Mouth/Throat:     Mouth: Mucous membranes are moist.     Pharynx: Oropharynx is clear.  Eyes:     Conjunctiva/sclera: Conjunctivae normal.  Cardiovascular:     Rate and Rhythm: Normal rate and regular rhythm.     Heart sounds: Normal heart sounds. No murmur heard. Pulmonary:     Effort: Pulmonary effort is normal.     Breath sounds: Normal breath sounds. No wheezing, rhonchi or rales.  Musculoskeletal:     Cervical back: Neck supple.  Skin:    General: Skin is warm.     Findings: No rash.  Neurological:     Mental Status: She is alert and oriented to person, place, and time.  Psychiatric:        Behavior: Behavior normal.   Previous notes and tests were  reviewed. The plan was reviewed with the patient/family, and all questions/concerned were addressed.  It was my pleasure to see Angela Harper today and participate in her care. Please feel free to contact me with any questions or concerns.  Sincerely,  Wyline Mood, DO Allergy & Immunology  [facility] office: [phone] [facility] office: [phone]

## 2021-01-23 NOTE — Assessment & Plan Note (Signed)
Tried Airduo a few times with no change in symptoms. Notices it more when laying down and only needed to use albuterol a few times per year with good benefit.  Today's spirometry was normal. . May use albuterol rescue inhaler 2 puffs every 4 to 6 hours as needed for shortness of breath, chest tightness, coughing, and wheezing. Monitor frequency of use.

## 2021-01-23 NOTE — Assessment & Plan Note (Signed)
Past history - 2019 skin testing was positive to grass, ragweed, mold, cat, dog, cockroach and dust mite. Interim history - not controlled and using Xyzal daily. Only using eye drops and nasal sprays prn. . Continue environmental control measures. . Use over the counter antihistamines such as Zyrtec (cetirizine), Claritin (loratadine), Allegra (fexofenadine), or Xyzal (levocetirizine) daily as needed. May take twice a day during allergy flares. May switch antihistamines every few months.  Start Singulair (montelukast) 10mg  daily at night.  Cautioned that in some children/adults can experience behavioral changes including hyperactivity, agitation, depression, sleep disturbances and suicidal ideations. These side effects are rare, but if you notice them you should notify me and discontinue Singulair (montelukast).  Start Xhance (fluticasone) nasal spray 2 sprays per nostril twice a day as needed for nasal congestion.  . May use refresh eye drops as needed.  . Use azelastine nasal spray 1-2 sprays per nostril twice a day as needed for runny nose/drainage. . Consider allergy injections for long term control if above medications do not help the symptoms - handout given.

## 2021-03-03 ENCOUNTER — Other Ambulatory Visit: Payer: Self-pay

## 2021-03-03 MED ORDER — LEVOCETIRIZINE DIHYDROCHLORIDE 5 MG PO TABS: 5.0000 mg | ORAL_TABLET | Freq: Every evening | ORAL | 5 refills | Status: DC

## 2021-04-03 ENCOUNTER — Other Ambulatory Visit: Payer: Self-pay

## 2021-04-03 ENCOUNTER — Encounter: Payer: Self-pay | Admitting: Allergy

## 2021-04-03 ENCOUNTER — Ambulatory Visit: Payer: BC Managed Care – PPO | Admitting: Allergy

## 2021-04-03 VITALS — BP 118/80 | HR 83 | Temp 98.2°F | Resp 18 | Ht 64.75 in | Wt 148.8 lb

## 2021-04-03 DIAGNOSIS — H1013 Acute atopic conjunctivitis, bilateral: Secondary | ICD-10-CM | POA: Diagnosis not present

## 2021-04-03 DIAGNOSIS — R0602 Shortness of breath: Secondary | ICD-10-CM

## 2021-04-03 DIAGNOSIS — J3089 Other allergic rhinitis: Secondary | ICD-10-CM

## 2021-04-03 DIAGNOSIS — J302 Other seasonal allergic rhinitis: Secondary | ICD-10-CM

## 2021-04-03 DIAGNOSIS — H7291 Unspecified perforation of tympanic membrane, right ear: Secondary | ICD-10-CM

## 2021-04-03 DIAGNOSIS — H1010 Acute atopic conjunctivitis, unspecified eye: Secondary | ICD-10-CM

## 2021-04-03 NOTE — Progress Notes (Signed)
Follow Up Note  RE: Angela Harper MRN: [id number] DOB: Jul 10, 1975 Date of Office Visit: 04/03/2021  Referring provider: Jeoffrey Massed, MD Primary care provider: Jeoffrey Massed, MD  Chief Complaint: Allergic Rhinitis , Asthma, and Follow-up (Pt states allergies and asthma symptoms are well managed, with occasional sneezing, dry cough, nasal congestion and dry throat.)  History of Present Illness: I had the pleasure of seeing Angela Harper for a follow up visit at [facility] [place] on 04/03/2021. She is a 45 y.o. female, who is being followed for allergic rhinoconjunctivitis, shortness of breath. Her previous allergy office visit was on 01/23/2021 with Dr. Selena Batten. Today is a regular follow up visit.  Seasonal and perennial allergic rhinoconjunctivitis Still sneezing but it may be less. Currently taking Singulair daily and Xyzal at night with some benefit.  Used Xhance with some benefit.  Had a few nights of coughing, chocking and waking up at night.   Less sneezing while on a vacation.   Using over the counter eye drops as needed.   2 cats at home.   Shortness of breath Using albuterol once every few months with good benefit. Denies any ER/urgent care visits or prednisone use since the last visit.  Perforated tympanic membrane, right Did not see ENT yet.   Assessment and Plan: [name] is a 45 y.o. female with: Seasonal and perennial allergic rhinoconjunctivitis Past history - 2019 skin testing was positive to grass, ragweed, mold, cat, dog, cockroach and dust mite. 2 cats at home. Zaditor caused dry eyes. Interim history - some improvement with Singulair but still having sneezing and congestion. Only using nasal sprays prn.  Continue environmental control measures. Use over the counter antihistamines such as Zyrtec (cetirizine), Claritin (loratadine), Allegra (fexofenadine), or Xyzal (levocetirizine) daily as needed. May take twice a day during allergy  flares. May switch antihistamines every few months. Continue Singulair (montelukast) 10mg  daily at night. Start Ryaltris (olopatadine + mometasone nasal spray combination) 1-2 sprays per nostril twice a day. Sample given.  If this works well, then let me know and will send in a prescription.  Stop Xhance and azelastine.  May use refresh eye drops as needed.  Consider allergy injections for long term control if above medications do not help the symptoms - most likely will do [place] office as she works closer to that office.  Shortness of breath Past history - Tried Airduo a few times with no change in symptoms. Notices it more when laying down and only needed to use albuterol a few times per year with good benefit. Interim history - some episodes every few months and using albuterol with good benefit.  Today's spirometry was normal. May use albuterol rescue inhaler 2 puffs every 4 to 6 hours as needed for shortness of breath, chest tightness, coughing, and wheezing. Monitor frequency of use.   Perforated tympanic membrane, right Recommend to follow up with ENT.  Return in about 3 months (around 07/04/2021).  No orders of the defined types were placed in this encounter.  Lab Orders  No laboratory test(s) ordered today    Diagnostics: Spirometry:  Tracings reviewed. Her effort: Good reproducible efforts. FVC: 2.86L FEV1: 2.66L, 91% predicted FEV1/FVC ratio: 93% Interpretation: Spirometry consistent with normal pattern.  Please see scanned spirometry results for details.  Medication List:  Current Outpatient Medications  Medication Sig Dispense Refill   albuterol (VENTOLIN HFA) 108 (90 Base) MCG/ACT inhaler Inhale 2 puffs into the lungs every 4 (four) hours as  needed for wheezing or shortness of breath (coughing fits). 18 g 2   levocetirizine (XYZAL) 5 MG tablet Take 1 tablet (5 mg total) by mouth every evening. 30 tablet 5   LO LOESTRIN FE 1 MG-10 MCG / 10 MCG tablet Take 1  tablet by mouth daily.  2   montelukast (SINGULAIR) 10 MG tablet Take 1 tablet (10 mg total) by mouth at bedtime. 30 tablet 5   Multiple Vitamin (MULTIVITAMIN) tablet Take 1 tablet by mouth daily.     Probiotic Product (PROBIOTIC PO) Take 1 capsule by mouth daily.     No current facility-administered medications for this visit.   Allergies: Allergies  Allergen Reactions   Codeine Nausea Only   I reviewed her past medical history, social history, family history, and environmental history and no significant changes have been reported from her previous visit.  Review of Systems  Constitutional:  Negative for appetite change, chills, fever and unexpected weight change.  HENT:  Positive for congestion and rhinorrhea.   Eyes:  Positive for itching.  Respiratory:  Positive for shortness of breath. Negative for cough, chest tightness and wheezing.   Gastrointestinal:  Negative for abdominal pain.  Skin:  Negative for rash.  Allergic/Immunologic: Positive for environmental allergies.  Neurological:  Negative for headaches.   Objective: BP 118/80   Pulse 83   Temp 98.2 F (36.8 C) (Temporal)   Resp 18   Ht 5' 4.75" (1.645 m)   Wt 148 lb 12.8 oz (67.5 kg)   SpO2 100%   BMI 24.95 kg/m  Body mass index is 24.95 kg/m. Physical Exam Vitals and nursing note reviewed.  Constitutional:      Appearance: Normal appearance. She is well-developed.  HENT:     Head: Normocephalic and atraumatic.     Right Ear: External ear normal.     Left Ear: External ear normal.     Ears:     Comments: Left ear - scarring of TM, right ear - perforated TM.    Nose: Nose normal.     Mouth/Throat:     Mouth: Mucous membranes are moist.     Pharynx: Oropharynx is clear.  Eyes:     Conjunctiva/sclera: Conjunctivae normal.  Cardiovascular:     Rate and Rhythm: Normal rate and regular rhythm.     Heart sounds: Normal heart sounds. No murmur heard. Pulmonary:     Effort: Pulmonary effort is normal.      Breath sounds: Normal breath sounds. No wheezing, rhonchi or rales.  Musculoskeletal:     Cervical back: Neck supple.  Skin:    General: Skin is warm.     Findings: No rash.  Neurological:     Mental Status: She is alert and oriented to person, place, and time.  Psychiatric:        Behavior: Behavior normal.  Previous notes and tests were reviewed. The plan was reviewed with the patient/family, and all questions/concerned were addressed.  It was my pleasure to see Angela Harper today and participate in her care. Please feel free to contact me with any questions or concerns.  Sincerely,  Wyline Mood, DO Allergy & Immunology  [facility] office: [phone] [facility] office: [phone]

## 2021-04-03 NOTE — Patient Instructions (Addendum)
Allergic rhinoconjunctivitis 2019 skin testing was positive to grass, ragweed, mold, cat, dog, cockroach and dust mite. Continue environmental control measures. Use over the counter antihistamines such as Zyrtec (cetirizine), Claritin (loratadine), Allegra (fexofenadine), or Xyzal (levocetirizine) daily as needed. May take twice a day during allergy flares. May switch antihistamines every few months. Continue Singulair (montelukast) 10mg  daily at night. Start Ryaltris (olopatadine + mometasone nasal spray combination) 1-2 sprays per nostril twice a day. Sample given.  If this works well, then let me know and will send in a prescription. It's $45 per bottle.  Stop Xhance and azelastine.  May use refresh eye drops as needed.  Consider allergy injections for long term control if above medications do not help the symptoms. We have an office in [place].   Shortness of breath Normal breathing test today. May use albuterol rescue inhaler 2 puffs every 4 to 6 hours as needed for shortness of breath, chest tightness, coughing, and wheezing. Monitor frequency of use.   Perforated tympanic membrane, right Recommend to follow up with ENT.  Follow up in 3 months or sooner if needed.

## 2021-04-03 NOTE — Assessment & Plan Note (Signed)
Recommend to follow-up with ENT.

## 2021-04-03 NOTE — Assessment & Plan Note (Signed)
Past history - Tried Airduo a few times with no change in symptoms. Notices it more when laying down and only needed to use albuterol a few times per year with good benefit. Interim history - some episodes every few months and using albuterol with good benefit.   Today's spirometry was normal. . May use albuterol rescue inhaler 2 puffs every 4 to 6 hours as needed for shortness of breath, chest tightness, coughing, and wheezing. Monitor frequency of use.

## 2021-04-03 NOTE — Assessment & Plan Note (Signed)
Past history - 2019 skin testing was positive to grass, ragweed, mold, cat, dog, cockroach and dust mite. 2 cats at home. Zaditor caused dry eyes. Interim history - some improvement with Singulair but still having sneezing and congestion. Only using nasal sprays prn.  . Continue environmental control measures. . Use over the counter antihistamines such as Zyrtec (cetirizine), Claritin (loratadine), Allegra (fexofenadine), or Xyzal (levocetirizine) daily as needed. May take twice a day during allergy flares. May switch antihistamines every few months.  Continue Singulair (montelukast) 10mg  daily at night. . Start Ryaltris (olopatadine + mometasone nasal spray combination) 1-2 sprays per nostril twice a day. Sample given.  o If this works well, then let me know and will send in a prescription.  . Stop Xhance and azelastine.  . May use refresh eye drops as needed.  . Consider allergy injections for long term control if above medications do not help the symptoms - most likely will do [place] office as she works closer to that office.

## 2021-07-28 ENCOUNTER — Other Ambulatory Visit: Payer: Self-pay | Admitting: Allergy

## 2021-08-12 ENCOUNTER — Ambulatory Visit (INDEPENDENT_AMBULATORY_CARE_PROVIDER_SITE_OTHER): Payer: BC Managed Care – PPO | Admitting: Allergy

## 2021-08-12 ENCOUNTER — Encounter: Payer: Self-pay | Admitting: Allergy

## 2021-08-12 ENCOUNTER — Other Ambulatory Visit: Payer: Self-pay

## 2021-08-12 VITALS — BP 138/86 | HR 74 | Temp 98.4°F | Resp 18

## 2021-08-12 DIAGNOSIS — R0602 Shortness of breath: Secondary | ICD-10-CM

## 2021-08-12 DIAGNOSIS — H1013 Acute atopic conjunctivitis, bilateral: Secondary | ICD-10-CM | POA: Diagnosis not present

## 2021-08-12 DIAGNOSIS — J302 Other seasonal allergic rhinitis: Secondary | ICD-10-CM

## 2021-08-12 DIAGNOSIS — J3089 Other allergic rhinitis: Secondary | ICD-10-CM

## 2021-08-12 DIAGNOSIS — H7291 Unspecified perforation of tympanic membrane, right ear: Secondary | ICD-10-CM

## 2021-08-12 MED ORDER — RYALTRIS 665-25 MCG/ACT NA SUSP: 1.0000 | Freq: Two times a day (BID) | NASAL | 5 refills | Status: DC

## 2021-08-12 NOTE — Assessment & Plan Note (Signed)
Past history - 2019 skin testing was positive to grass, ragweed, mold, cat, dog, cockroach and dust mite. 2 cats at home. Zaditor caused dry eyes. Interim history - sinus pressure, getting over URI, not sure if Ryaltris helped as only used the sample.  Continue environmental control measures.  Use over the counter antihistamines such as Zyrtec (cetirizine), Claritin (loratadine), Allegra (fexofenadine), or Xyzal (levocetirizine) daily as needed. May take twice a day during allergy flares. May switch antihistamines every few months.  Continue Singulair (montelukast) 10mg  daily at night.  Start Ryaltris (olopatadine + mometasone nasal spray combination) 1-2 sprays per nostril twice a day. Sample given.  o Let know if too expensive. o Otherwise may use Flonase or Nasacort or Nasonex 1-2 sprays per nostril once a day for nasal symptoms.   May use refresh eye drops as needed.   Consider allergy injections for long term control if above medications do not help the symptoms. o We have an office in [place].   May take over the counter nasal decongestant such as sudafed or Afrin for a few days in a row.

## 2021-08-12 NOTE — Patient Instructions (Addendum)
Allergic rhinoconjunctivitis 2019 skin testing was positive to grass, ragweed, mold, cat, dog, cockroach and dust mite. Continue environmental control measures. Use over the counter antihistamines such as Zyrtec (cetirizine), Claritin (loratadine), Allegra (fexofenadine), or Xyzal (levocetirizine) daily as needed. May take twice a day during allergy flares. May switch antihistamines every few months. Continue Singulair (montelukast) 10mg  daily at night. Start Ryaltris (olopatadine + mometasone nasal spray combination) 1-2 sprays per nostril twice a day. Sample given.  Let know if too expensive. Otherwise may use Flonase or Nasacort or Nasonex 1-2 sprays per nostril once a day for nasal symptoms.  May use refresh eye drops as needed.  Consider allergy injections for long term control if above medications do not help the symptoms. We have an office in [place].  May take over the counter nasal decongestant such as sudafed or Afrin for a few days in a row.   Shortness of breath May use albuterol rescue inhaler 2 puffs every 4 to 6 hours as needed for shortness of breath, chest tightness, coughing, and wheezing. Monitor frequency of use.   Perforated tympanic membrane, right Recommend to follow up with ENT.  Follow up in 6 months or sooner if needed.

## 2021-08-12 NOTE — Assessment & Plan Note (Signed)
Past history - Tried Airduo a few times with no change in symptoms. Notices it more when laying down and only needed to use albuterol a few times per year with good benefit. Interim history - used albuterol 3 times since last visit.   May use albuterol rescue inhaler 2 puffs every 4 to 6 hours as needed for shortness of breath, chest tightness, coughing, and wheezing. Monitor frequency of use.

## 2021-08-12 NOTE — Assessment & Plan Note (Signed)
·   Recommend to follow up with ENT.

## 2021-08-12 NOTE — Progress Notes (Signed)
Follow Up Note  RE: Angela Harper MRN: [id number] DOB: 04/13/1976 Date of Office Visit: 08/12/2021  Referring provider: Jeoffrey Massed, MD Primary care provider: Jeoffrey Massed, MD  Chief Complaint: Allergies (She's been having sinus infections a lot, she doesn't think it's necessarily her allergies )  History of Present Illness: I had the pleasure of seeing Angela Harper for a follow up visit at the [facility] on 08/12/2021. She is a 46 y.o. female, who is being followed for allergic rhinoconjunctivitis, shortness of breath. Her previous allergy office visit was on 04/03/2021 with Dr. Selena Batten. Today is a regular follow up visit.  Seasonal and perennial allergic rhinoconjunctivitis A few weeks ago had a viral URI and having some sick contacts at home and at work. She has been having issues with some sinus pressure.  No antibiotics or prednisone since the last visit.  Took zyrtec-D for a few days with good benefit. No fevers/chills. Still has some thick clear drainage which is improving slowly. It used to be greenish.   Currently taking Singulair, zyrtec-D. Using saline spray.   Finished Ryaltris and not sure if it helped. Did not see ENT yet - planning on going this summer.    Shortness of breath Took albuterol about 3 times since the last visit with good benefit.  Denies any ER/urgent care visits or prednisone use since the last visit.  Assessment and Plan: Angela Harper is a 46 y.o. female with: Seasonal and perennial allergic rhinoconjunctivitis Past history - 2019 skin testing was positive to grass, ragweed, mold, cat, dog, cockroach and dust mite. 2 cats at home. Zaditor caused dry eyes. Interim history - sinus pressure, getting over URI, not sure if Ryaltris helped as only used the sample. Continue environmental control measures. Use over the counter antihistamines such as Zyrtec (cetirizine), Claritin (loratadine), Allegra (fexofenadine), or Xyzal  (levocetirizine) daily as needed. May take twice a day during allergy flares. May switch antihistamines every few months. Continue Singulair (montelukast) 10mg  daily at night. Start Ryaltris (olopatadine + mometasone nasal spray combination) 1-2 sprays per nostril twice a day. Sample given.  Let know if too expensive. Otherwise may use Flonase or Nasacort or Nasonex 1-2 sprays per nostril once a day for nasal symptoms.  May use refresh eye drops as needed.  Consider allergy injections for long term control if above medications do not help the symptoms. We have an office in [place].  May take over the counter nasal decongestant such as sudafed or Afrin for a few days in a row.   Perforated tympanic membrane, right Recommend to follow up with ENT.  Shortness of breath Past history - Tried Airduo a few times with no change in symptoms. Notices it more when laying down and only needed to use albuterol a few times per year with good benefit. Interim history - used albuterol 3 times since last visit.  May use albuterol rescue inhaler 2 puffs every 4 to 6 hours as needed for shortness of breath, chest tightness, coughing, and wheezing. Monitor frequency of use.   Return in about 6 months (around 02/09/2022).  Meds ordered this encounter  Medications   Olopatadine-Mometasone (RYALTRIS) 665-25 MCG/ACT SUSP    Sig: Place 1-2 sprays into the nose in the morning and at bedtime.    Dispense:  29 g    Refill:  5    [phone]   Lab Orders  No laboratory test(s) ordered today    Diagnostics: None.  Medication List:  Current Outpatient Medications  Medication Sig Dispense Refill   albuterol (VENTOLIN HFA) 108 (90 Base) MCG/ACT inhaler Inhale 2 puffs into the lungs every 4 (four) hours as needed for wheezing or shortness of breath (coughing fits). 18 g 2   levocetirizine (XYZAL) 5 MG tablet Take 1 tablet (5 mg total) by mouth every evening. 30 tablet 5   LO LOESTRIN FE 1 MG-10 MCG / 10  MCG tablet Take 1 tablet by mouth daily.  2   montelukast (SINGULAIR) 10 MG tablet TAKE 1 TABLET BY MOUTH EVERYDAY AT BEDTIME 30 tablet 5   Multiple Vitamin (MULTIVITAMIN) tablet Take 1 tablet by mouth daily.     Olopatadine-Mometasone (RYALTRIS) X543819 MCG/ACT SUSP Place 1-2 sprays into the nose in the morning and at bedtime. 29 g 5   Probiotic Product (PROBIOTIC PO) Take 1 capsule by mouth daily.     No current facility-administered medications for this visit.   Allergies: Allergies  Allergen Reactions   Codeine Nausea Only   I reviewed her past medical history, social history, family history, and environmental history and no significant changes have been reported from her previous visit.  Review of Systems  Constitutional:  Negative for appetite change, chills, fever and unexpected weight change.  HENT:  Positive for congestion, rhinorrhea and sinus pressure.   Eyes:  Negative for itching.  Respiratory:  Negative for cough, chest tightness, shortness of breath and wheezing.   Gastrointestinal:  Negative for abdominal pain.  Skin:  Negative for rash.  Allergic/Immunologic: Positive for environmental allergies.  Neurological:  Negative for headaches.   Objective: BP 138/86 (BP Location: Left Arm, Patient Position: Sitting, Cuff Size: Normal)    Pulse 74    Temp 98.4 F (36.9 C) (Temporal)    Resp 18    SpO2 98%  There is no height or weight on file to calculate BMI. Physical Exam Vitals and nursing note reviewed.  Constitutional:      Appearance: Normal appearance. She is well-developed.  HENT:     Head: Normocephalic and atraumatic.     Right Ear: External ear normal.     Left Ear: External ear normal.     Ears:     Comments: Left ear - scarring of TM, right ear - perforated TM.    Nose: Nose normal.     Mouth/Throat:     Mouth: Mucous membranes are moist.     Pharynx: Oropharynx is clear.  Eyes:     Conjunctiva/sclera: Conjunctivae normal.  Cardiovascular:     Rate  and Rhythm: Normal rate and regular rhythm.     Heart sounds: Normal heart sounds. No murmur heard. Pulmonary:     Effort: Pulmonary effort is normal.     Breath sounds: Normal breath sounds. No wheezing, rhonchi or rales.  Musculoskeletal:     Cervical back: Neck supple.  Skin:    General: Skin is warm.     Findings: No rash.  Neurological:     Mental Status: She is alert and oriented to person, place, and time.  Psychiatric:        Behavior: Behavior normal.  Previous notes and tests were reviewed. The plan was reviewed with the patient/family, and all questions/concerned were addressed.  It was my pleasure to see Angela Harper today and participate in her care. Please feel free to contact me with any questions or concerns.  Sincerely,  Wyline Mood, DO Allergy & Immunology  [facility] office:  [phone] [facility] office: [phone]

## 2021-08-26 ENCOUNTER — Other Ambulatory Visit: Payer: Self-pay | Admitting: Allergy

## 2022-01-06 ENCOUNTER — Other Ambulatory Visit: Payer: Self-pay

## 2022-01-06 MED ORDER — MONTELUKAST SODIUM 10 MG PO TABS: ORAL_TABLET | ORAL | 0 refills | Status: DC

## 2022-01-06 MED ORDER — LEVOCETIRIZINE DIHYDROCHLORIDE 5 MG PO TABS: ORAL_TABLET | ORAL | 0 refills | Status: DC

## 2022-01-22 ENCOUNTER — Telehealth: Payer: Self-pay

## 2022-01-22 MED ORDER — MONTELUKAST SODIUM 10 MG PO TABS: ORAL_TABLET | ORAL | 0 refills | Status: DC

## 2022-01-22 MED ORDER — LEVOCETIRIZINE DIHYDROCHLORIDE 5 MG PO TABS: ORAL_TABLET | ORAL | 0 refills | Status: DC

## 2022-01-22 NOTE — Telephone Encounter (Signed)
Pt called stating she needed a refill on Montelukast and Levocetirizine. I sent a courtesy refill into the pharmacy

## 2022-02-05 ENCOUNTER — Other Ambulatory Visit: Payer: Self-pay

## 2022-02-05 ENCOUNTER — Ambulatory Visit: Payer: BC Managed Care – PPO | Admitting: Allergy

## 2022-02-05 MED ORDER — RYALTRIS 665-25 MCG/ACT NA SUSP: 1.0000 | Freq: Two times a day (BID) | NASAL | 5 refills | Status: DC

## 2022-02-12 NOTE — Progress Notes (Signed)
[address] [place] [address] Dept: [phone]  FOLLOW UP NOTE  Patient ID: Angela Harper, female    DOB: 1975/12/09  Age: 46 y.o. MRN: [id number] Date of Office Visit: 02/13/2022  Assessment  Chief Complaint: Allergic Rhinitis  (Allergies come and go.Has noticed one nostril being stuffy. ) and Other (Snores at night. )  HPI Angela Harper is a 46 year old female who presents the clinic for follow-up visit.  She was last seen in this clinic on 08/12/2021 by Dr. Selena Batten for evaluation of allergic rhinitis, allergic conjunctivitis, shortness of breath, and perforated right TM.  At today's visit, she reports that she occasionally experiences shortness of breath at nighttime while lying on her back which resolves after she turns over to her side.  She denies wheeze and experiences occasional cough producing mucus related to postnasal drainage. She has not used her albuterol inhaler since her last visit to this clinic. Allergic rhinitis is reported as moderately well controlled with symptoms including occasional clear rhinorrhea occurring while eating, nasal congestion mostly at night, sneezing, and post nasal drainage with frequent throat clearing. She continues levocetirizine 5 mg once a day and Ryaltris twice a day. She is infrequently using nasal saline rinses. She is not interested in allergen immunotherapy at this time.  her last environmental allergy testing was on 06/21/2018 and was positive to grass pollen, ragweed pollen, mold, cat, dog, cockroach, and dust mite. Allergic conjunctivitis is reported as moderately well controlled with occasional red eyes for which she has used Visine with some relief of symptoms. She continues a lubricating eye drop with moderate relief of dry eye symptoms. Her current medications are listed in the chart.    Drug Allergies:  Allergies  Allergen Reactions   Codeine Nausea Only    Physical Exam: BP 122/84   Pulse 69   Temp 97.9  F (36.6 C)   Resp 20   Ht 5\' 4"  (1.626 m)   Wt 151 lb 6 oz (68.7 kg)   SpO2 99%   BMI 25.98 kg/m    Physical Exam Vitals reviewed.  Constitutional:      Appearance: Normal appearance.  HENT:     Head: Normocephalic and atraumatic.     Right Ear: Tympanic membrane normal.     Left Ear: Tympanic membrane normal.     Nose:     Comments: Bilateral nares slightly erythematous with clear nasal drainage noted. Pharynx slightly erythematous with clear nasal drainage noted. Ears normal. Eyes normal. Eyes:     Conjunctiva/sclera: Conjunctivae normal.  Cardiovascular:     Rate and Rhythm: Normal rate and regular rhythm.     Heart sounds: Normal heart sounds. No murmur heard. Pulmonary:     Effort: Pulmonary effort is normal.     Breath sounds: Normal breath sounds.     Comments: Lungs clear to auscultation Musculoskeletal:        General: Normal range of motion.     Cervical back: Normal range of motion and neck supple.  Skin:    General: Skin is warm and dry.  Neurological:     Mental Status: She is alert and oriented to person, place, and time.  Psychiatric:        Mood and Affect: Mood normal.        Behavior: Behavior normal.        Thought Content: Thought content normal.        Judgment: Judgment normal.     Diagnostics: FVC 3.40,  FEV1 2.68.  Predicted FVC 3.53, predicted FEV1 2.85.  Spirometry indicates normal ventilatory function.  Assessment and Plan: 1. Seasonal and perennial allergic rhinoconjunctivitis   2. Perforated tympanic membrane, right   3. Shortness of breath      Patient Instructions  Shortness of breath Continue montelukast 10 mg once a day to prevent cough or wheeze Continue albuterol 2 puffs once every 4 hours as needed for cough or wheeze You may use albuterol 2 puffs 5 to 15 minutes before activity to decrease cough or wheeze  Allergic rhinitis Continue allergen avoidance measures directed toward grass pollen, ragweed pollen, mold, cat, dog,  cockroach, and dust mite as listed below Continue cetirizine once a day as needed for runny nose or itch Continue Ryaltris 2 sprays in each nostril twice a day as needed for stuffy nose Consider saline nasal rinses as needed for nasal symptoms. Use this before any medicated nasal sprays for best result For thick postnasal drainage, begin Mucinex 600 to 1200 mg twice a day and increase fluid intake to thin mucus Consider allergen immunotherapy if your symptoms are not well controlled with the treatment plan as listed above  Allergic conjunctivitis Continue a lubricating eyedrop several times a day as needed Some over the counter eye drops include Pataday one drop in each eye once a day as needed for red, itchy eyes OR Zaditor one drop in each eye twice a day as needed for red itchy eyes.  Call the clinic if this treatment plan is not working well for you.  Follow up in 6 months or sooner if needed.   Return in about 6 months (around 08/16/2022), or if symptoms worsen or fail to improve.    Thank you for the opportunity to care for this patient.  Please do not hesitate to contact me with questions.  Thermon Leyland, FNP [facility]

## 2022-02-12 NOTE — Patient Instructions (Signed)
Shortness of breath Continue montelukast 10 mg once a day to prevent cough or wheeze Continue albuterol 2 puffs once every 4 hours as needed for cough or wheeze You may use albuterol 2 puffs 5 to 15 minutes before activity to decrease cough or wheeze  Allergic rhinitis Continue allergen avoidance measures directed toward grass pollen, ragweed pollen, mold, cat, dog, cockroach, and dust mite as listed below Continue cetirizine once a day as needed for runny nose or itch Continue Ryaltris 2 sprays in each nostril twice a day as needed for stuffy nose Consider saline nasal rinses as needed for nasal symptoms. Use this before any medicated nasal sprays for best result For thick postnasal drainage, begin Mucinex 600 to 1200 mg twice a day and increase fluid intake to thin mucus Consider allergen immunotherapy if your symptoms are not well controlled with the treatment plan as listed above  Allergic conjunctivitis Continue a lubricating eyedrop several times a day as needed Some over the counter eye drops include Pataday one drop in each eye once a day as needed for red, itchy eyes OR Zaditor one drop in each eye twice a day as needed for red itchy eyes.  Call the clinic if this treatment plan is not working well for you.  Follow up in 6 months or sooner if needed.  Reducing Pollen Exposure The American Academy of Allergy, Asthma and Immunology suggests the following steps to reduce your exposure to pollen during allergy seasons. Do not hang sheets or clothing out to dry; pollen may collect on these items. Do not mow lawns or spend time around freshly cut grass; mowing stirs up pollen. Keep windows closed at night.  Keep car windows closed while driving. Minimize morning activities outdoors, a time when pollen counts are usually at their highest. Stay indoors as much as possible when pollen counts or humidity is high and on windy days when pollen tends to remain in the air longer. Use air  conditioning when possible.  Many air conditioners have filters that trap the pollen spores. Use a HEPA room air filter to remove pollen form the indoor air you breathe.  Control of Mold Allergen Mold and fungi can grow on a variety of surfaces provided certain temperature and moisture conditions exist.  Outdoor molds grow on plants, decaying vegetation and soil.  The major outdoor mold, Alternaria and Cladosporium, are found in very high numbers during hot and dry conditions.  Generally, a late Summer - Fall peak is seen for common outdoor fungal spores.  Rain will temporarily lower outdoor mold spore count, but counts rise rapidly when the rainy period ends.  The most important indoor molds are Aspergillus and Penicillium.  Dark, humid and poorly ventilated basements are ideal sites for mold growth.  The next most common sites of mold growth are the bathroom and the kitchen.  Outdoor [employer] Use air conditioning and keep windows closed Avoid exposure to decaying vegetation. Avoid leaf raking. Avoid grain handling. Consider wearing a face mask if working in moldy areas.  Indoor Mold Control Maintain humidity below 50%. Clean washable surfaces with 5% bleach solution. Remove sources e.g. Contaminated carpets.  Control of Dog or Cat Allergen Avoidance is the best way to manage a dog or cat allergy. If you have a dog or cat and are allergic to dog or cats, consider removing the dog or cat from the home. If you have a dog or cat but don't want to find it a new home, or if  your family wants a pet even though someone in the household is allergic, here are some strategies that may help keep symptoms at bay:  Keep the pet out of your bedroom and restrict it to only a few rooms. Be advised that keeping the dog or cat in only one room will not limit the allergens to that room. Don't pet, hug or kiss the dog or cat; if you do, wash your hands with soap and water. High-efficiency particulate air  (HEPA) cleaners run continuously in a bedroom or living room can reduce allergen levels over time. Regular use of a high-efficiency vacuum cleaner or a central vacuum can reduce allergen levels. Giving your dog or cat a bath at least once a week can reduce airborne allergen.   Control of Dust Mite Allergen Dust mites play a major role in allergic asthma and rhinitis. They occur in environments with high humidity wherever human skin is found. Dust mites absorb humidity from the atmosphere (ie, they do not drink) and feed on organic matter (including shed human and animal skin). Dust mites are a microscopic type of insect that you cannot see with the naked eye. High levels of dust mites have been detected from mattresses, pillows, carpets, upholstered furniture, bed covers, clothes, soft toys and any woven material. The principal allergen of the dust mite is found in its feces. A gram of dust may contain 1,000 mites and 250,000 fecal particles. Mite antigen is easily measured in the air during house cleaning activities. Dust mites do not bite and do not cause harm to humans, other than by triggering allergies/asthma.  Ways to decrease your exposure to dust mites in your home:  1. Encase mattresses, box springs and pillows with a mite-impermeable barrier or cover  2. Wash sheets, blankets and drapes weekly in hot water (130 F) with detergent and dry them in a dryer on the hot setting.  3. Have the room cleaned frequently with a vacuum cleaner and a damp dust-mop. For carpeting or rugs, vacuuming with a vacuum cleaner equipped with a high-efficiency particulate air (HEPA) filter. The dust mite allergic individual should not be in a room which is being cleaned and should wait 1 hour after cleaning before going into the room.  4. Do not sleep on upholstered furniture (eg, couches).  5. If possible removing carpeting, upholstered furniture and drapery from the home is ideal. Horizontal blinds should be  eliminated in the rooms where the person spends the most time (bedroom, study, television room). Washable vinyl, roller-type shades are optimal.  6. Remove all non-washable stuffed toys from the bedroom. Wash stuffed toys weekly like sheets and blankets above.  7. Reduce indoor humidity to less than 50%. Inexpensive humidity monitors can be purchased at most hardware stores. Do not use a humidifier as can make the problem worse and are not recommended.   Control of Cockroach Allergen Cockroach allergen has been identified as an important cause of acute attacks of asthma, especially in urban settings.  There are fifty-five species of cockroach that exist in the [place], however only three, the [place], [place] species produce allergen that can affect patients with Asthma.  Allergens can be obtained from fecal particles, egg casings and secretions from cockroaches.    Remove food sources. Reduce access to water. Seal access and entry points. Spray runways with 0.5-1% Diazinon or Chlorpyrifos Blow boric acid power under stoves and refrigerator. Place bait stations (hydramethylnon) at feeding sites.

## 2022-02-13 ENCOUNTER — Encounter: Payer: Self-pay | Admitting: Family Medicine

## 2022-02-13 ENCOUNTER — Ambulatory Visit: Payer: BC Managed Care – PPO | Admitting: Family Medicine

## 2022-02-13 VITALS — BP 122/84 | HR 69 | Temp 97.9°F | Resp 20 | Ht 64.0 in | Wt 151.4 lb

## 2022-02-13 DIAGNOSIS — H7291 Unspecified perforation of tympanic membrane, right ear: Secondary | ICD-10-CM

## 2022-02-13 DIAGNOSIS — J302 Other seasonal allergic rhinitis: Secondary | ICD-10-CM

## 2022-02-13 DIAGNOSIS — R0602 Shortness of breath: Secondary | ICD-10-CM

## 2022-02-13 DIAGNOSIS — H1013 Acute atopic conjunctivitis, bilateral: Secondary | ICD-10-CM

## 2022-02-13 MED ORDER — LEVOCETIRIZINE DIHYDROCHLORIDE 5 MG PO TABS
ORAL_TABLET | ORAL | 5 refills | Status: DC
Start: 2022-02-13 — End: 2022-08-19

## 2022-02-13 MED ORDER — MONTELUKAST SODIUM 10 MG PO TABS: ORAL_TABLET | ORAL | 5 refills | Status: DC

## 2022-02-26 ENCOUNTER — Ambulatory Visit: Payer: BC Managed Care – PPO | Admitting: Allergy

## 2022-03-27 ENCOUNTER — Other Ambulatory Visit: Payer: Self-pay

## 2022-03-27 MED ORDER — RYALTRIS 665-25 MCG/ACT NA SUSP: 1.0000 | Freq: Two times a day (BID) | NASAL | 5 refills | Status: DC

## 2022-04-02 ENCOUNTER — Telehealth: Payer: Self-pay

## 2022-04-02 NOTE — Telephone Encounter (Signed)
Pa submitted for rylatris cover my meds key [id number]

## 2022-08-19 ENCOUNTER — Ambulatory Visit: Payer: BC Managed Care – PPO | Admitting: Allergy & Immunology

## 2022-08-19 ENCOUNTER — Other Ambulatory Visit: Payer: Self-pay

## 2022-08-19 ENCOUNTER — Encounter: Payer: Self-pay | Admitting: Allergy & Immunology

## 2022-08-19 VITALS — BP 122/70 | HR 90 | Temp 98.2°F | Resp 20 | Ht 64.0 in | Wt 153.2 lb

## 2022-08-19 DIAGNOSIS — L2089 Other atopic dermatitis: Secondary | ICD-10-CM | POA: Diagnosis not present

## 2022-08-19 DIAGNOSIS — J4530 Mild persistent asthma, uncomplicated: Secondary | ICD-10-CM

## 2022-08-19 DIAGNOSIS — H1013 Acute atopic conjunctivitis, bilateral: Secondary | ICD-10-CM

## 2022-08-19 DIAGNOSIS — J302 Other seasonal allergic rhinitis: Secondary | ICD-10-CM

## 2022-08-19 DIAGNOSIS — H1010 Acute atopic conjunctivitis, unspecified eye: Secondary | ICD-10-CM

## 2022-08-19 MED ORDER — MONTELUKAST SODIUM 10 MG PO TABS
ORAL_TABLET | ORAL | 2 refills | Status: DC
Start: 2022-08-19 — End: 2023-02-17

## 2022-08-19 MED ORDER — LEVOCETIRIZINE DIHYDROCHLORIDE 5 MG PO TABS: ORAL_TABLET | ORAL | 2 refills | Status: DC

## 2022-08-19 MED ORDER — AIRSUPRA 90-80 MCG/ACT IN AERO
2.0000 | INHALATION_SPRAY | RESPIRATORY_TRACT | 5 refills | Status: DC | PRN
Start: 2022-08-19 — End: 2024-03-03

## 2022-08-19 MED ORDER — TRIAMCINOLONE ACETONIDE 0.1 % EX CREA: 1.0000 | TOPICAL_CREAM | Freq: Two times a day (BID) | CUTANEOUS | 5 refills | Status: DC

## 2022-08-19 NOTE — Patient Instructions (Addendum)
1. Seasonal and perennial allergic rhinoconjunctivitis - Continue with levocetirizine daily. - Continue with montelukast '10mg'$  daily.   2. Mild persistent asthma, uncomplicated - Lung testing looks good today. - We are going to change you from albuterol to AirSupra (contains albuterol and an inhaled steroid). - Sample provided and copay card provided.  - Daily controller medication(s): Singulair (montelukast) '10mg'$  daily - Prior to physical activity: AirSupra two puffs 10-15 minutes before physical activity. - Rescue medications: AirSupra 2 puffs every 4-6 hours as needed  - Asthma control goals:  * Full participation in all desired activities (may need albuterol before activity) * Albuterol use two time or less a week on average (not counting use with activity) * Cough interfering with sleep two time or less a month * Oral steroids no more than once a year * No hospitalizations  3. Flexural atopic dermatitis - Add on triamcinolone 0.1 % cream twice daily as needed. - Give [place] an update in 1-2 weeks.  4. Return in about 6 months (around 02/17/2023).    Please inform us of any Emergency Department visits, hospitalizations, or changes in symptoms. Call us before going to the ED for breathing or allergy symptoms since we might be able to fit you in for a sick visit. Feel free to contact us anytime with any questions, problems, or concerns.  It was a pleasure to meet you today!  Websites that have reliable patient information: 1. American Academy of Asthma, Allergy, and Immunology: www.aaaai.org 2. Food Allergy Research and Education (FARE): foodallergy.org 3. Mothers of Asthmatics: http://www.asthmacommunitynetwork.org 4. American College of Allergy, Asthma, and Immunology: www.acaai.org   COVID-19 Vaccine Information can be found at: [email or web address].uk For questions related to vaccine distribution or appointments, please email  [email or web address] or call [phone].   We realize that you might be concerned about having an allergic reaction to the COVID19 vaccines. To help with that concern, WE ARE OFFERING THE COVID19 VACCINES IN OUR OFFICE! Ask the front desk for dates!     "Like" [place] on Facebook and Instagram for our latest updates!      A healthy democracy works best when [employer] participate! Make sure you are registered to vote! If you have moved or changed any of your contact information, you will need to get this updated before voting!  In some cases, you MAY be able to register to vote online: [email or web address]

## 2022-08-19 NOTE — Progress Notes (Unsigned)
FOLLOW UP  Date of Service/Encounter:  08/19/22   Assessment:   Mild persistent asthma, uncomplicated   Seasonal and perennial allergic rhinitis  Atopic dermatitis - with worsening symptoms on the hands  Plan/Recommendations:   1. Seasonal and perennial allergic rhinoconjunctivitis - Continue with levocetirizine daily. - Continue with montelukast 73m daily.   2. Mild persistent asthma, uncomplicated - Lung testing looks good today. - We are going to change you from albuterol to AirSupra (contains albuterol and an inhaled steroid). - Sample provided and copay card provided.  - Daily controller medication(s): Singulair (montelukast) 135mdaily - Prior to physical activity: AirSupra two puffs 10-15 minutes before physical activity. - Rescue medications: AirSupra 2 puffs every 4-6 hours as needed  - Asthma control goals:  * Full participation in all desired activities (may need albuterol before activity) * Albuterol use two time or less a week on average (not counting use with activity) * Cough interfering with sleep two time or less a month * Oral steroids no more than once a year * No hospitalizations  3. Flexural atopic dermatitis - Add on triamcinolone 0.1 % cream twice daily as needed. - Give us[place] update in 1-2 weeks.  4. Return in about 6 months (around 02/17/2023).   Subjective:   Angela KEEGANs a [age].o. female presenting today for follow up of  Chief Complaint  Patient presents with   Follow-up    Angela PERISHOas a history of the following: Patient Active Problem List   Diagnosis Date Noted   Shortness of breath 01/23/2021   Perforated tympanic membrane, right 01/23/2021   Seasonal allergic conjunctivitis 06/21/2018   Dyspnea 06/21/2018   Depression     History obtained from: chart review and patient.  Angela Harper a [age].o. female presenting for a follow up visit.  She was last seen in August 2023.  At that time, we continue with  montelukast as well as albuterol for her shortness of breath.  For her allergic rhinitis, she continued with avoidance measures of all of her allergens.  We continue with cetirizine as well as Ryaltris 2 sprays per nostril twice daily as needed.  Allergen immunotherapy was discussed.  Since last visit, she has largely done well.  Asthma/Respiratory Symptom History: She has been having trouble catching her breath at night. She has to roll onto her side and she uses albuterol with improvement in her symptoms. This is needed rarely, but she has needed it twice this week. Symptoms overall come and go. She is not sure weather this is worse today.    Allergic Rhinitis Symptom History: She remains on the levocetirizine as well as the montelukast.  She has not had any antibiotics for any sinus infections, ear infections, or bronchitis.  Skin Symptom History: She reports that she has had itchiness of her hands. She has had this on her back in the past. She has noticed that the itchiness around her hands and wrists becomes so raw that it bleeds. She has been using regular lotion on to help with this. [facility]. She was not really diagnosed with anything in particular, but she was given something OTC like Cerve. She does not think that she got a prescription ointment but the last time was lotions only.   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 She has a child that goes to school at  [employer] in [place].   Otherwise, there have been no changes to her past medical history, surgical history, family history, or social history.    Review of Systems  Constitutional: Negative.  Negative for chills, fever, malaise/fatigue and weight loss.  HENT: Negative.  Negative for congestion, ear discharge and ear pain.   Eyes:  Negative for pain, discharge and redness.  Respiratory:  Negative for cough,  sputum production, shortness of breath and wheezing.   Cardiovascular: Negative.  Negative for chest pain and palpitations.  Gastrointestinal:  Negative for abdominal pain, constipation, diarrhea, heartburn, nausea and vomiting.  Skin:  Positive for itching and rash.  Neurological:  Negative for dizziness and headaches.  Endo/Heme/Allergies:  Negative for environmental allergies. Does not bruise/bleed easily.       Objective:   Blood pressure 122/70, pulse 90, temperature 98.2 F (36.8 C), resp. rate 20, height 5' 4"$  (1.626 m), weight 153 lb 3.2 oz (69.5 kg), SpO2 99 %. Body mass index is 26.3 kg/m.    Physical Exam Constitutional:      Appearance: She is well-developed.  HENT:     Head: Normocephalic and atraumatic.     Right Ear: Tympanic membrane, ear canal and external ear normal.     Left Ear: Tympanic membrane, ear canal and external ear normal.     Nose: No nasal deformity, septal deviation, mucosal edema or rhinorrhea.     Right Turbinates: Not enlarged or swollen.     Left Turbinates: Not enlarged or swollen.     Right Sinus: No maxillary sinus tenderness or frontal sinus tenderness.     Left Sinus: No maxillary sinus tenderness or frontal sinus tenderness.     Mouth/Throat:     Mouth: Mucous membranes are not pale and not dry.     Pharynx: Uvula midline.  Eyes:     General: Lids are normal. No allergic shiner.       Right eye: No discharge.        Left eye: No discharge.     Conjunctiva/sclera: Conjunctivae normal.     Right eye: Right conjunctiva is not injected. No chemosis.    Left eye: Left conjunctiva is not injected. No chemosis.    Pupils: Pupils are equal, round, and reactive to light.  Cardiovascular:     Rate and Rhythm: Normal rate and regular rhythm.     Heart sounds: Normal heart sounds.  Pulmonary:     Effort: Pulmonary effort is normal. No tachypnea, accessory muscle usage or respiratory distress.     Breath sounds: Normal breath sounds. No  wheezing, rhonchi or rales.  Chest:     Chest wall: No tenderness.  Lymphadenopathy:     Cervical: No cervical adenopathy.  Skin:    General: Skin is warm.     Capillary Refill: Capillary refill takes less than 2 seconds.     Coloration: Skin is not pale.     Findings: No abrasion, erythema, petechiae or rash. Rash is not papular, urticarial or vesicular.     Comments: Excoriations on the bilateral hands. There are no urticaria present.  Neurological:     Mental Status: She is alert.  Psychiatric:        Behavior: Behavior is cooperative.      Diagnostic studies:    Spirometry: results normal (FEV1: 2.74/97%, FVC: 3.37/96%, FEV1/FVC: 81%).    Spirometry consistent with normal pattern.    Allergy Studies: none        Salvatore Marvel, MD  [facility]

## 2022-08-20 ENCOUNTER — Encounter: Payer: Self-pay | Admitting: Allergy & Immunology

## 2022-12-15 DIAGNOSIS — M25552 Pain in left hip: Secondary | ICD-10-CM | POA: Diagnosis not present

## 2022-12-15 DIAGNOSIS — M79671 Pain in right foot: Secondary | ICD-10-CM | POA: Diagnosis not present

## 2022-12-17 DIAGNOSIS — Z01419 Encounter for gynecological examination (general) (routine) without abnormal findings: Secondary | ICD-10-CM | POA: Diagnosis not present

## 2022-12-17 DIAGNOSIS — Z6826 Body mass index (BMI) 26.0-26.9, adult: Secondary | ICD-10-CM | POA: Diagnosis not present

## 2022-12-25 DIAGNOSIS — M2021 Hallux rigidus, right foot: Secondary | ICD-10-CM | POA: Diagnosis not present

## 2022-12-29 ENCOUNTER — Encounter: Payer: Self-pay | Admitting: Family Medicine

## 2023-02-17 ENCOUNTER — Ambulatory Visit: Payer: BC Managed Care – PPO | Admitting: Allergy & Immunology

## 2023-02-17 ENCOUNTER — Encounter: Payer: Self-pay | Admitting: Allergy & Immunology

## 2023-02-17 VITALS — BP 122/82 | HR 72 | Temp 98.1°F | Resp 16 | Ht 63.58 in | Wt 151.5 lb

## 2023-02-17 DIAGNOSIS — J4530 Mild persistent asthma, uncomplicated: Secondary | ICD-10-CM | POA: Diagnosis not present

## 2023-02-17 DIAGNOSIS — H7291 Unspecified perforation of tympanic membrane, right ear: Secondary | ICD-10-CM | POA: Diagnosis not present

## 2023-02-17 DIAGNOSIS — H1010 Acute atopic conjunctivitis, unspecified eye: Secondary | ICD-10-CM

## 2023-02-17 DIAGNOSIS — H1013 Acute atopic conjunctivitis, bilateral: Secondary | ICD-10-CM

## 2023-02-17 DIAGNOSIS — J302 Other seasonal allergic rhinitis: Secondary | ICD-10-CM | POA: Diagnosis not present

## 2023-02-17 DIAGNOSIS — J3089 Other allergic rhinitis: Secondary | ICD-10-CM

## 2023-02-17 DIAGNOSIS — L2089 Other atopic dermatitis: Secondary | ICD-10-CM | POA: Diagnosis not present

## 2023-02-17 NOTE — Progress Notes (Unsigned)
FOLLOW UP  Date of Service/Encounter:  02/18/23   Assessment:   Mild persistent asthma, uncomplicated    Seasonal and perennial allergic rhinitis   Atopic dermatitis - with worsening symptoms on the hands  Plan/Recommendations:   1. Seasonal and perennial allergic rhinoconjunctivitis - Continue with levocetirizine daily. - Continue with montelukast 10mg  daily.  - I thought that quercetin was dosed more often, but it looks like this is just daily. - You could certainly try taking it to see how it works.  - Information on allergy shots provided.   2. Mild persistent asthma, uncomplicated - Lung testing looks good today. - Everything is looking great today.  - Daily controller medication(s): Singulair (montelukast) 10mg  daily - Prior to physical activity: albuterol  two puffs 10-15 minutes before physical activity. - Rescue medications: albuterol 2 puffs every 4-6 hours as needed  - Asthma control goals:  * Full participation in all desired activities (may need albuterol before activity) * Albuterol use two time or less a week on average (not counting use with activity) * Cough interfering with sleep two time or less a month * Oral steroids no more than once a year * No hospitalizations  3. Flexural atopic dermatitis - Continue with triamcinolone 0.1 % cream twice daily as needed. - Give [place] an update in 1-2 weeks.  4. Return in about 6 months (around 08/20/2023).    Subjective:   Angela Harper is a 47 y.o. female presenting today for follow up of  Chief Complaint  Patient presents with   Follow-up    Angela Harper has a history of the following: Patient Active Problem List   Diagnosis Date Noted   Shortness of breath 01/23/2021   Perforated tympanic membrane, right 01/23/2021   Seasonal allergic conjunctivitis 06/21/2018   Dyspnea 06/21/2018   Depression     History obtained from: chart review and patient.  Angela Harper is a 47 y.o. female presenting for  a follow up visit.  He was last seen in February 2024.  At that time, we continue with levocetirizine as well as montelukast.  For her asthma, her lung testing looked great.  We changed her from albuterol to AirSupra.  We also continue with Singulair 10 mg daily.  Atopic dermatitis was not controlled.  We added on triamcinolone 0.1% cream twice daily as needed.  Since the last visit, she has done well.  Asthma/Respiratory Symptom History: Breathing  is about the same. She has been using some puffs before physical activity. The PRN plan is working well for her. She has noticed that her nose has been more stuffy and swollen lately. It has bene more difficult to breathe. She has as needed medications.  Allergic Rhinitis Symptom History: She is wondering  about quercetin.  Apparently her mother uses this and swears by it.  She does have worsening symptoms when she is off of it. She has levocetirizine that she takes daily. She remains on the montelukast as well.  She started having worsening allergy symptoms during her last pregnancy.they never got better after the most recent pregnancy.   Skin Symptom History: She has been having more bumps lately. She is not using it daily. She thinks that the dry air is making it worse.  Otherwise, there have been no changes to her past medical history, surgical history, family history, or social history.    Review of systems otherwise negative other than that mentioned in the HPI.    Objective:   Blood pressure  122/82, pulse 72, temperature 98.1 F (36.7 C), resp. rate 16, height 5' 3.58" (1.615 m), weight 151 lb 8 oz (68.7 kg), SpO2 97%. Body mass index is 26.35 kg/m.    Physical Exam Vitals reviewed.  Constitutional:      Appearance: She is well-developed.  HENT:     Head: Normocephalic and atraumatic.     Right Ear: Tympanic membrane, ear canal and external ear normal.     Left Ear: Tympanic membrane, ear canal and external ear normal.     Nose:  Mucosal edema and rhinorrhea present. No nasal deformity or septal deviation.     Right Turbinates: Enlarged, swollen and pale.     Left Turbinates: Enlarged, swollen and pale.     Right Sinus: No maxillary sinus tenderness or frontal sinus tenderness.     Left Sinus: No maxillary sinus tenderness or frontal sinus tenderness.     Mouth/Throat:     Lips: Pink.     Mouth: Mucous membranes are moist. Mucous membranes are not pale and not dry.     Tongue: No lesions.     Palate: No mass.     Pharynx: Uvula midline.  Eyes:     General: Lids are normal. No allergic shiner.       Right eye: No discharge.        Left eye: No discharge.     Conjunctiva/sclera: Conjunctivae normal.     Right eye: Right conjunctiva is not injected. No chemosis.    Left eye: Left conjunctiva is not injected. No chemosis.    Pupils: Pupils are equal, round, and reactive to light.  Cardiovascular:     Rate and Rhythm: Normal rate and regular rhythm.     Heart sounds: Normal heart sounds.  Pulmonary:     Effort: Pulmonary effort is normal. No tachypnea, accessory muscle usage or respiratory distress.     Breath sounds: Normal breath sounds. No wheezing, rhonchi or rales.  Chest:     Chest wall: No tenderness.  Lymphadenopathy:     Cervical: No cervical adenopathy.  Skin:    General: Skin is warm.     Capillary Refill: Capillary refill takes less than 2 seconds.     Coloration: Skin is not pale.     Findings: No abrasion, erythema, petechiae or rash. Rash is not papular, urticarial or vesicular.     Comments: Excoriations on the bilateral hands. There are no urticaria present.  Neurological:     Mental Status: She is alert.  Psychiatric:        Behavior: Behavior is cooperative.      Diagnostic studies:    Spirometry: results normal (FEV1: 2.86/105%, FVC: 3.64/107%, FEV1/FVC: 79%).    Spirometry consistent with normal pattern.   Allergy Studies: none        Malachi Bonds, MD  [facility]

## 2023-02-17 NOTE — Patient Instructions (Addendum)
1. Seasonal and perennial allergic rhinoconjunctivitis - Continue with levocetirizine daily. - Continue with montelukast 10mg  daily.  - I thought that quercetin was dosed more often, but it looks like this is just daily. - You could certainly try taking it to see how it works.  - Information on allergy shots provided.   2. Mild persistent asthma, uncomplicated - Lung testing looks good today. - Everything is looking great today.  - Daily controller medication(s): Singulair (montelukast) 10mg  daily - Prior to physical activity: albuterol  two puffs 10-15 minutes before physical activity. - Rescue medications: albuterol 2 puffs every 4-6 hours as needed  - Asthma control goals:  * Full participation in all desired activities (may need albuterol before activity) * Albuterol use two time or less a week on average (not counting use with activity) * Cough interfering with sleep two time or less a month * Oral steroids no more than once a year * No hospitalizations  3. Flexural atopic dermatitis - Continue with triamcinolone 0.1 % cream twice daily as needed. - Give [place] an update in 1-2 weeks.  4. Return in about 6 months (around 08/20/2023).    Please inform us of any Emergency Department visits, hospitalizations, or changes in symptoms. Call us before going to the ED for breathing or allergy symptoms since we might be able to fit you in for a sick visit. Feel free to contact us anytime with any questions, problems, or concerns.  It was a pleasure to see you again today!  Websites that have reliable patient information: 1. American Academy of Asthma, Allergy, and Immunology: www.aaaai.org 2. Food Allergy Research and Education (FARE): foodallergy.org 3. Mothers of Asthmatics: http://www.asthmacommunitynetwork.org 4. American College of Allergy, Asthma, and Immunology: www.acaai.org   COVID-19 Vaccine Information can be found at:  [email or web address] For questions related to vaccine distribution or appointments, please email [email or web address] or call [phone].   We realize that you might be concerned about having an allergic reaction to the COVID19 vaccines. To help with that concern, WE ARE OFFERING THE COVID19 VACCINES IN OUR OFFICE! Ask the front desk for dates!     "Like" [place] on Facebook and Instagram for our latest updates!      A healthy democracy works best when [employer] participate! Make sure you are registered to vote! If you have moved or changed any of your contact information, you will need to get this updated before voting!  In some cases, you MAY be able to register to vote online: [email or web address]    Allergy Shots  Allergies are the result of a chain reaction that starts in the immune system. Your immune system controls how your body defends itself. For instance, if you have an allergy to pollen, your immune system identifies pollen as an invader or allergen. Your immune system overreacts by producing antibodies called Immunoglobulin E (IgE). These antibodies travel to cells that release chemicals, causing an allergic reaction.  The concept behind allergy immunotherapy, whether it is received in the form of shots or tablets, is that the immune system can be desensitized to specific allergens that trigger allergy symptoms. Although it requires time and patience, the payback can be long-term relief. Allergy injections contain a dilute solution of those substances that you are allergic to based upon your skin testing and allergy history.   How Do Allergy Shots Work?  Allergy shots work much like a vaccine. Your body responds to injected amounts of a particular allergen given in increasing doses, eventually developing a resistance  and tolerance to it. Allergy shots can lead to decreased, minimal or no allergy  symptoms.  There generally are two phases: build-up and maintenance. Build-up often ranges from three to six months and involves receiving injections with increasing amounts of the allergens. The shots are typically given once or twice a week, though more rapid build-up schedules are sometimes used.  The maintenance phase begins when the most effective dose is reached. This dose is different for each person, depending on how allergic you are and your response to the build-up injections. Once the maintenance dose is reached, there are longer periods between injections, typically two to four weeks.  Occasionally doctors give cortisone-type shots that can temporarily reduce allergy symptoms. These types of shots are different and should not be confused with allergy immunotherapy shots.  Who Can Be Treated with Allergy Shots?  Allergy shots may be a good treatment approach for people with allergic rhinitis (hay fever), allergic asthma, conjunctivitis (eye allergy) or stinging insect allergy.   Before deciding to begin allergy shots, you should consider:   The length of allergy season and the severity of your symptoms  Whether medications and/or changes to your environment can control your symptoms  Your desire to avoid long-term medication use  Time: allergy immunotherapy requires a major time commitment  Cost: may vary depending on your insurance coverage  Allergy shots for children age 75 and older are effective and often well tolerated. They might prevent the onset of new allergen sensitivities or the progression to asthma.  Allergy shots are not started on patients who are pregnant but can be continued on patients who become pregnant while receiving them. In some patients with other medical conditions or who take certain common medications, allergy shots may be of risk. It is important to mention other medications you talk to your allergist.   What are the two types of build-ups offered:    RUSH or Rapid Desensitization -- one day of injections lasting from 8:30-4:30pm, injections every 1 hour.  Approximately half of the build-up process is completed in that one day.  The following week, normal build-up is resumed, and this entails ~16 visits either weekly or twice weekly, until reaching your "maintenance dose" which is continued weekly until eventually getting spaced out to every month for a duration of 3 to 5 years. The regular build-up appointments are nurse visits where the injections are administered, followed by required monitoring for 30 minutes.    Traditional build-up -- weekly visits for 6 -12 months until reaching "maintenance dose", then continue weekly until eventually spacing out to every 4 weeks as above. At these appointments, the injections are administered, followed by required monitoring for 30 minutes.     Either way is acceptable, and both are equally effective. With the rush protocol, the advantage is that less time is spent here for injections overall AND you would also reach maintenance dosing faster (which is when the clinical benefit starts to become more apparent). Not everyone is a candidate for rapid desensitization.   IF we proceed with the RUSH protocol, there are premedications which must be taken the day before and the day after the rush only (this includes antihistamines, steroids, and Singulair).  After the rush day, no prednisone or Singulair is required, and we just recommend antihistamines taken on your injection day.  What Is An Estimate of the Costs?  If you are interested in starting allergy injections, please check with your insurance company about your coverage for both allergy  vial sets and allergy injections.  Please do so prior to making the appointment to start injections.  The following are CPT codes to give to your insurance company. These are the amounts we BILL to the insurance company, but the amount YOU WILL PAY and WE RECEIVE IS  SUBSTANTIALLY LESS and depends on the contracts we have with different insurance companies.   Amount Billed to Insurance One allergy vial set  CPT 95165   $ 1200     Two allergy vial set  CPT 95165   $ 2400     Three allergy vial set  CPT 95165   $ 3600     One injection   CPT 95115   $ 35  Two injections   CPT 95117   $ 40 RUSH (Rapid Desensitization) CPT 95180 x 8 hours $500/hour  Regarding the allergy injections, your co-pay may or may not apply with each injection, so please confirm this with your insurance company. When you start allergy injections, 1 or 2 sets of vials are made based on your allergies.  Not all patients can be on one set of vials. A set of vials lasts 6 months to a year depending on how quickly you can proceed with your build-up of your allergy injections. Vials are personalized for each patient depending on their specific allergens.  How often are allergy injection given during the build-up period?   Injections are given at least weekly during the build-up period until your maintenance dose is achieved. Per the doctor's discretion, you may have the option of getting allergy injections two times per week during the build-up period. However, there must be at least 48 hours between injections. The build-up period is usually completed within 6-12 months depending on your ability to schedule injections and for adjustments for reactions. When maintenance dose is reached, your injection schedule is gradually changed to every two weeks and later to every three weeks. Injections will then continue every 4 weeks. Usually, injections are continued for a total of 3-5 years.   When Will I Feel Better?  Some may experience decreased allergy symptoms during the build-up phase. For others, it may take as long as 12 months on the maintenance dose. If there is no improvement after a year of maintenance, your allergist will discuss other treatment options with you.  If you aren't responding  to allergy shots, it may be because there is not enough dose of the allergen in your vaccine or there are missing allergens that were not identified during your allergy testing. Other reasons could be that there are high levels of the allergen in your environment or major exposure to non-allergic triggers like tobacco smoke.  What Is the Length of Treatment?  Once the maintenance dose is reached, allergy shots are generally continued for three to five years. The decision to stop should be discussed with your allergist at that time. Some people may experience a permanent reduction of allergy symptoms. Others may relapse and a longer course of allergy shots can be considered.  What Are the Possible Reactions?  The two types of adverse reactions that can occur with allergy shots are local and systemic. Common local reactions include very mild redness and swelling at the injection site, which can happen immediately or several hours after. Report a delayed reaction from your last injection. These include arm swelling or runny nose, watery eyes or cough that occurs within 12-24 hours after injection. A systemic reaction, which is less common, affects the  entire body or a particular body system. They are usually mild and typically respond quickly to medications. Signs include increased allergy symptoms such as sneezing, a stuffy nose or hives.   Rarely, a serious systemic reaction called anaphylaxis can develop. Symptoms include swelling in the throat, wheezing, a feeling of tightness in the chest, nausea or dizziness. Most serious systemic reactions develop within 30 minutes of allergy shots. This is why it is strongly recommended you wait in your doctor's office for 30 minutes after your injections. Your allergist is trained to watch for reactions, and his or her staff is trained and equipped with the proper medications to identify and treat them.   Report to the nurse immediately if you experience any of the  following symptoms: swelling, itching or redness of the skin, hives, watery eyes/nose, breathing difficulty, excessive sneezing, coughing, stomach pain, diarrhea, or light headedness. These symptoms may occur within 15-20 minutes after injection and may require medication.   Who Should Administer Allergy Shots?  The preferred location for receiving shots is your prescribing allergist's office. Injections can sometimes be given at another facility where the physician and staff are trained to recognize and treat reactions, and have received instructions by your prescribing allergist.  What if I am late for an injection?   Injection dose will be adjusted depending upon how many days or weeks you are late for your injection.   What if I am sick?   Please report any illness to the nurse before receiving injections. She may adjust your dose or postpone injections depending on your symptoms. If you have fever, flu, sinus infection or chest congestion it is best to postpone allergy injections until you are better. Never get an allergy injection if your asthma is causing you problems. If your symptoms persist, seek out medical care to get your health problem under control.  What If I am or Become Pregnant:  Women that become pregnant should schedule an appointment with [facility] before receiving any further allergy injections.

## 2023-02-18 ENCOUNTER — Encounter: Payer: Self-pay | Admitting: Allergy & Immunology

## 2023-02-18 MED ORDER — TRIAMCINOLONE ACETONIDE 0.1 % EX CREA: 1.0000 | TOPICAL_CREAM | Freq: Two times a day (BID) | CUTANEOUS | 5 refills | Status: AC | PRN

## 2023-02-18 MED ORDER — MONTELUKAST SODIUM 10 MG PO TABS: ORAL_TABLET | ORAL | 2 refills | Status: DC

## 2023-02-18 MED ORDER — ALBUTEROL SULFATE HFA 108 (90 BASE) MCG/ACT IN AERS: 2.0000 | INHALATION_SPRAY | RESPIRATORY_TRACT | 2 refills | Status: AC | PRN

## 2023-02-18 MED ORDER — RYALTRIS 665-25 MCG/ACT NA SUSP: 1.0000 | Freq: Two times a day (BID) | NASAL | 5 refills | Status: DC

## 2023-02-18 MED ORDER — LEVOCETIRIZINE DIHYDROCHLORIDE 5 MG PO TABS: ORAL_TABLET | ORAL | 2 refills | Status: DC

## 2023-03-05 DIAGNOSIS — M2021 Hallux rigidus, right foot: Secondary | ICD-10-CM | POA: Diagnosis not present

## 2023-08-27 ENCOUNTER — Encounter: Payer: Self-pay | Admitting: Allergy & Immunology

## 2023-08-27 ENCOUNTER — Ambulatory Visit: Payer: BC Managed Care – PPO | Admitting: Allergy & Immunology

## 2023-08-27 VITALS — BP 136/86 | HR 93 | Temp 97.6°F | Resp 16 | Wt 153.2 lb

## 2023-08-27 DIAGNOSIS — J4530 Mild persistent asthma, uncomplicated: Secondary | ICD-10-CM

## 2023-08-27 DIAGNOSIS — H1013 Acute atopic conjunctivitis, bilateral: Secondary | ICD-10-CM

## 2023-08-27 DIAGNOSIS — J302 Other seasonal allergic rhinitis: Secondary | ICD-10-CM | POA: Diagnosis not present

## 2023-08-27 DIAGNOSIS — H1010 Acute atopic conjunctivitis, unspecified eye: Secondary | ICD-10-CM

## 2023-08-27 DIAGNOSIS — L2089 Other atopic dermatitis: Secondary | ICD-10-CM | POA: Diagnosis not present

## 2023-08-27 DIAGNOSIS — J3089 Other allergic rhinitis: Secondary | ICD-10-CM

## 2023-08-27 MED ORDER — MONTELUKAST SODIUM 10 MG PO TABS: ORAL_TABLET | ORAL | 3 refills | Status: DC

## 2023-08-27 MED ORDER — LEVOCETIRIZINE DIHYDROCHLORIDE 5 MG PO TABS: ORAL_TABLET | ORAL | 3 refills | Status: DC

## 2023-08-27 NOTE — Patient Instructions (Addendum)
 1. Seasonal and perennial allergic rhinoconjunctivitis - Continue with levocetirizine daily. - Continue with montelukast 10mg  daily.  - Consider allergy shots for long term control. - Information provided on these. - This is a big time commitment at first, but it spaces out to monthly after a period of time. - We would have to do repeat allergy testing to do this since your last testing was in 2019. t  2. Mild persistent asthma, uncomplicated - Lung testing looks over 100% today. - Sample of Brezti provided today to use at night.  - Call us with an update mid week.  - Daily controller medication(s): Breztri two puffs at night via spacer + Singulair (montelukast) 10mg  daily - Prior to physical activity: albuterol  two puffs 10-15 minutes before physical activity. - Rescue medications: albuterol 2 puffs every 4-6 hours as needed  - Asthma control goals:  * Full participation in all desired activities (may need albuterol before activity) * Albuterol use two time or less a week on average (not counting use with activity) * Cough interfering with sleep two time or less a month * Oral steroids no more than once a year * No hospitalizations  3. Flexural atopic dermatitis - Continue with triamcinolone 0.1 % cream twice daily as needed.  4. Return in about 6 months (around 02/27/2024). You can have the follow up appointment with Dr. Dellis Anes or a Nurse Practicioner (our Nurse Practitioners are excellent and always have Physician oversight!).    Please inform us of any Emergency Department visits, hospitalizations, or changes in symptoms. Call us before going to the ED for breathing or allergy symptoms since we might be able to fit you in for a sick visit. Feel free to contact us anytime with any questions, problems, or concerns.  It was a pleasure to see you again today!  Websites that have reliable patient information: 1. American Academy of Asthma, Allergy, and Immunology: www.aaaai.org 2.  Food Allergy Research and Education (FARE): foodallergy.org 3. Mothers of Asthmatics: http://www.asthmacommunitynetwork.org 4. American College of Allergy, Asthma, and Immunology: www.acaai.org      "Like" [place] on Facebook and Instagram for our latest updates!      A healthy democracy works best when [employer] participate! Make sure you are registered to vote! If you have moved or changed any of your contact information, you will need to get this updated before voting! Scan the QR codes below to learn more!

## 2023-08-27 NOTE — Progress Notes (Signed)
 FOLLOW UP  Date of Service/Encounter:  08/30/23   Assessment:   Mild persistent asthma, uncomplicated   Shortness of breath that worsens at night - declined a trial of an H2 blocker   Seasonal and perennial allergic rhinitis   Atopic dermatitis - with worsening symptoms on the hands  Consider GERD, but she declined Pepcid   Plan/Recommendations:   1. Seasonal and perennial allergic rhinoconjunctivitis - Continue with levocetirizine daily. - Continue with montelukast 10mg  daily.  - Consider allergy shots for long term control. - Information provided on these. - This is a big time commitment at first, but it spaces out to monthly after a period of time. - We would have to do repeat allergy testing to do this since your last testing was in 2019. t  2. Mild persistent asthma, uncomplicated - Lung testing looks over 100% today. - Sample of Brezti provided today to use at night.  - Call us with an update mid week.  - Daily controller medication(s): Breztri two puffs at night via spacer + Singulair (montelukast) 10mg  daily - Prior to physical activity: albuterol  two puffs 10-15 minutes before physical activity. - Rescue medications: albuterol 2 puffs every 4-6 hours as needed  - Asthma control goals:  * Full participation in all desired activities (may need albuterol before activity) * Albuterol use two time or less a week on average (not counting use with activity) * Cough interfering with sleep two time or less a month * Oral steroids no more than once a year * No hospitalizations  3. Flexural atopic dermatitis - Continue with triamcinolone 0.1 % cream twice daily as needed.  4. Return in about 6 months (around 02/27/2024). You can have the follow up appointment with Dr. Dellis Anes or a Nurse Practicioner (our Nurse Practitioners are excellent and always have Physician oversight!).    Subjective:   Angela Harper is a 48 y.o. 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  Discussed the use of AI scribe software for clinical note transcription with the patient and/or guardian, who gave verbal consent to proceed.  Angela Harper is a 48 y.o. female presenting for a follow up visit.  She was last seen in August 2024.  At that time, we continued with levocetirizine and montelukast for her allergic rhinitis.  We did talk about allergen immunotherapy.  She also was using quercetin for her allergy symptoms.  Her lung testing looked good.  We continue with montelukast 10 mg daily as well as albuterol as needed.  Her skin was controlled with triamcinolone 0.1% cream twice daily as needed.  Since last visit, she has largely done fairly well.  Asthma/Respiratory Symptom History: She has been experiencing shortness of breath for the past week, which worsens when lying on her back, particularly at night, causing her to wake up unable to catch her breath. Albuterol provides relief, but she does not use it frequently. The shortness of breath can occur at any time, even when sitting, but is most problematic at night.  Allergic  Rhinitis Symptom History: She has a history of asthma and allergies and has not been taking her allergy shots recently.  She was on them a while ago, but has not restarted them in several years.  She experiences frequent sneezing and takes montelukast and levocetirizine, which she used to take at night but now takes earlier in the day. She reports dizziness and nausea about an hour after taking these medications, along with feeling exhausted.  She  has a history of ear surgeries, including reconstructive surgery and multiple tube placements due to recurrent ear infections as a child. She has perforations in her ears and has been advised by an ENT to consider further surgery to improve her hearing.   Skin Symptom History: She has experienced itchy patches on her skin, which she attributes to allergic dermatitis. She has seen a dermatologist who removed a suspicious area on her foot, but it was not conclusively diagnosed as melanoma.  Otherwise, there have been no changes to her past medical history, surgical history, family history, or social history.    Review of systems otherwise negative other than that mentioned in the HPI.    Objective:   Blood pressure 136/86, pulse 93, temperature 97.6 F (36.4 C), resp. rate 16, weight 153 lb 4 oz (69.5 kg), SpO2 97%. Body mass index is 26.65 kg/m.    Physical Exam Vitals reviewed.  Constitutional:      Appearance: She is well-developed.     Comments: Very pleasant. Cooperative with the exam.   HENT:     Head: Normocephalic and atraumatic.     Right Ear: Tympanic membrane, ear canal and external ear normal.     Left Ear: Tympanic membrane, ear canal and external ear normal.     Nose: Mucosal edema and rhinorrhea present. No nasal deformity or septal deviation.     Right Turbinates: Enlarged, swollen and pale.     Left Turbinates: Enlarged, swollen and pale.     Right Sinus: No maxillary sinus tenderness or frontal sinus tenderness.     Left Sinus: No maxillary sinus tenderness or frontal sinus tenderness.     Comments: No nasal polyps note.d     Mouth/Throat:     Lips: Pink.     Mouth: Mucous membranes are moist. Mucous membranes are not pale and not dry.     Tongue: No lesions.     Palate: No mass.     Pharynx: Uvula midline.  Eyes:     General: Lids are normal. No allergic shiner.       Right eye: No discharge.        Left eye: No discharge.     Conjunctiva/sclera:  Conjunctivae normal.     Right eye: Right conjunctiva is not injected. No chemosis.    Left eye: Left conjunctiva is not injected. No chemosis.    Pupils: Pupils are equal, round, and reactive to light.  Cardiovascular:     Rate and Rhythm: Normal rate and regular rhythm.     Heart sounds: Normal heart sounds.  Pulmonary:     Effort: Pulmonary effort is normal. No tachypnea, accessory muscle usage or respiratory distress.     Breath sounds: Normal breath sounds. No wheezing, rhonchi or rales.  Chest:     Chest wall: No tenderness.  Lymphadenopathy:     Cervical: No cervical adenopathy.  Skin:    General: Skin is warm.     Capillary Refill: Capillary refill takes less than 2 seconds.  Coloration: Skin is not pale.     Findings: No abrasion, erythema, petechiae or rash. Rash is not papular, urticarial or vesicular.     Comments: Excoriations on the bilateral hands. There are no urticaria present.  Neurological:     Mental Status: She is alert.  Psychiatric:        Behavior: Behavior is cooperative.      Diagnostic studies:    Spirometry: results normal (FEV1: 2.91/107%, FVC: 3.59/107%, FEV1/FVC: 81%).    Spirometry consistent with normal pattern.    Allergy Studies: none       Malachi Bonds, MD  [facility]

## 2023-08-30 ENCOUNTER — Encounter: Payer: Self-pay | Admitting: Allergy & Immunology

## 2023-12-29 DIAGNOSIS — Z1231 Encounter for screening mammogram for malignant neoplasm of breast: Secondary | ICD-10-CM | POA: Diagnosis not present

## 2023-12-29 DIAGNOSIS — Z124 Encounter for screening for malignant neoplasm of cervix: Secondary | ICD-10-CM | POA: Diagnosis not present

## 2023-12-29 DIAGNOSIS — Z01419 Encounter for gynecological examination (general) (routine) without abnormal findings: Secondary | ICD-10-CM | POA: Diagnosis not present

## 2023-12-29 DIAGNOSIS — Z6825 Body mass index (BMI) 25.0-25.9, adult: Secondary | ICD-10-CM | POA: Diagnosis not present

## 2024-03-03 ENCOUNTER — Ambulatory Visit (INDEPENDENT_AMBULATORY_CARE_PROVIDER_SITE_OTHER): Admitting: Allergy & Immunology

## 2024-03-03 ENCOUNTER — Encounter: Payer: Self-pay | Admitting: Allergy & Immunology

## 2024-03-03 ENCOUNTER — Other Ambulatory Visit: Payer: Self-pay

## 2024-03-03 VITALS — BP 120/80 | HR 86 | Temp 97.9°F | Resp 18 | Ht 63.78 in | Wt 149.6 lb

## 2024-03-03 DIAGNOSIS — J302 Other seasonal allergic rhinitis: Secondary | ICD-10-CM | POA: Diagnosis not present

## 2024-03-03 DIAGNOSIS — L2089 Other atopic dermatitis: Secondary | ICD-10-CM

## 2024-03-03 DIAGNOSIS — J3089 Other allergic rhinitis: Secondary | ICD-10-CM

## 2024-03-03 DIAGNOSIS — J4530 Mild persistent asthma, uncomplicated: Secondary | ICD-10-CM

## 2024-03-03 DIAGNOSIS — H1010 Acute atopic conjunctivitis, unspecified eye: Secondary | ICD-10-CM

## 2024-03-03 DIAGNOSIS — H7291 Unspecified perforation of tympanic membrane, right ear: Secondary | ICD-10-CM

## 2024-03-03 MED ORDER — MONTELUKAST SODIUM 10 MG PO TABS: ORAL_TABLET | ORAL | 3 refills | Status: AC

## 2024-03-03 MED ORDER — LEVOCETIRIZINE DIHYDROCHLORIDE 5 MG PO TABS: ORAL_TABLET | ORAL | 3 refills | Status: AC

## 2024-03-03 NOTE — Progress Notes (Unsigned)
 FOLLOW UP  Date of Service/Encounter:  03/03/24   Assessment:   Mild persistent asthma, uncomplicated    Shortness of breath that worsens at night - declined a trial of an H2 blocker   Seasonal and perennial allergic rhinitis   Atopic dermatitis - with worsening symptoms on the hands   Consider GERD, but she declined Pepcid   Plan/Recommendations:   There are no Patient Instructions on file for this visit.   Subjective:   ADEOLA DENNEN is a 48 y.o. female presenting today for follow up of  Chief Complaint  Patient presents with   Follow-up    VENNESA BASTEDO has a history of the following: Patient Active Problem List   Diagnosis Date Noted   Shortness of breath 01/23/2021   Perforated tympanic membrane, right 01/23/2021   Seasonal allergic conjunctivitis 06/21/2018   Dyspnea 06/21/2018   Depression     History obtained from: chart review and {Persons; PED relatives w/patient:19415::patient}.  Discussed the use of AI scribe software for clinical note transcription with the patient and/or guardian, who gave verbal consent to proceed.  Carlise is a 48 y.o. female presenting for {Blank single:19197::a food challenge,a drug challenge,skin testing,a sick visit,an evaluation of ***,a follow up visit}.  She was last seen in March 2025.  At that time, we continue with levocetirizine daily as well as montelukast .  We talked her about allergy  shots for long-term control.  Her lung testing looked excellent.  We gave her sample of Breztri to use.  S continue with albuterol  as needed.  Atopic dermatitis was controlled with triamcinolone  0.1% cream twice daily as needed.  Since last visit,  Discussed the use of AI scribe software for clinical note transcription with the patient, who gave verbal consent to proceed.  History of Present Illness   TYIA BINFORD is a 48 year old female with asthma and allergies who presents for follow-up of her respiratory  conditions.  Her allergies have remained stable since her last visit six months ago. She continues to take montelukast  and levocetirizine regularly, with occasional use of Allegra. Nasal sprays, primarily saline solutions, are used as needed to clear her nasal passages. She experiences persistent nasal dripping, particularly on the left side, and thick nasal secretions that are difficult to clear. She has been told she has a slight deviated septum on the left side. She declines additional medication to dry up her nose due to concerns about dehydration.  Asthma symptoms were more problematic during the summer, especially on humid days, causing difficulty in breathing even after using her asthma spray. Improvement in breathing has been noted with the change in weather, and she has been using her albuterol  inhaler less frequently. She has not used her inhaler since a walk over the summer. Occasionally, she experiences difficulty breathing at night, particularly when lying on her back, but this occurs infrequently and is managed by changing positions. When necessary, she uses her inhaler, which provides quick relief.  She is currently taking Xyzal  and montelukast  regularly. She also uses quercetin, which she feels has helped her symptoms. She has not used Breztri as she did not like the powdery feel of it.  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  She has a history of ear surgeries and a persistent hole in one ear, which occasionally feels wet but does not drain significantly. She avoids submerging her  head in water to prevent ear infections.       Asthma/Respiratory Symptom History: ***  Allergic Rhinitis Symptom History: ***  Food Allergy  Symptom History: ***  Skin Symptom History: ***  GERD Symptom History: ***  Infection Symptom History: ***  She is a Human resources officer at  [employer]. She is only at that school. She has been there for 4 years. She has a child that goes to school at [employer] in [place].     Otherwise, there have been no changes to her past medical history, surgical history, family history, or social history.    Review of systems otherwise negative other than that mentioned in the HPI.    Objective:   Blood pressure 120/80, pulse 86, temperature 97.9 F (36.6 C), temperature source Temporal, resp. rate 18, height 5' 3.78 (1.62 m), weight 149 lb 9.6 oz (67.9 kg), SpO2 98%. Body mass index is 25.86 kg/m.    Physical Exam   Diagnostic studies:    Spirometry: results normal (FEV1: 2.87/106%, FVC: 3.68/110%, FEV1/FVC: 78%).    Spirometry consistent with normal pattern.    Allergy  Studies: none       Marty Shaggy, MD  [facility] 

## 2024-03-03 NOTE — Patient Instructions (Addendum)
 1. Seasonal and perennial allergic rhinoconjunctivitis - Continue with levocetirizine daily. - Feel free to alternate with other antihistamines to maintain effectiveness.  - Continue with montelukast  10mg  daily.  - Consider allergy  shots for long term control.   2. Mild persistent asthma, uncomplicated - Lung testing looks over 100% today. - Daily controller medication(s): Singulair  (montelukast ) 10mg  daily - Prior to physical activity: albuterol   two puffs 10-15 minutes before physical activity. - Rescue medications: albuterol  2 puffs every 4-6 hours as needed  - Asthma control goals:  * Full participation in all desired activities (may need albuterol  before activity) * Albuterol  use two time or less a week on average (not counting use with activity) * Cough interfering with sleep two time or less a month * Oral steroids no more than once a year * No hospitalizations  3. Flexural atopic dermatitis - Continue with triamcinolone  0.1 % cream twice daily as needed.  4. Return in about 1 year (around 03/03/2025). You can have the follow up appointment with Dr. Iva or a Nurse Practicioner (our Nurse Practitioners are excellent and always have Physician oversight!).    Please inform us  of any Emergency Department visits, hospitalizations, or changes in symptoms. Call us  before going to the ED for breathing or allergy  symptoms since we might be able to fit you in for a sick visit. Feel free to contact us  anytime with any questions, problems, or concerns.  It was a pleasure to see you again today!  Websites that have reliable patient information: 1. American Academy of Asthma, Allergy , and Immunology: www.aaaai.org 2. Food Allergy  Research and Education (FARE): foodallergy.org 3. Mothers of Asthmatics: http://www.asthmacommunitynetwork.org 4. American College of Allergy , Asthma, and Immunology: www.acaai.org      "Like" us  on Facebook and Instagram for our latest updates!       A healthy democracy works best when [employer] participate! Make sure you are registered to vote! If you have moved or changed any of your contact information, you will need to get this updated before voting! Scan the QR codes below to learn more!

## 2024-03-04 ENCOUNTER — Encounter: Payer: Self-pay | Admitting: Allergy & Immunology

## 2024-03-14 ENCOUNTER — Ambulatory Visit: Payer: Self-pay

## 2024-03-14 NOTE — Telephone Encounter (Signed)
 FYI Only or Action Required?: FYI only for provider.  Patient was last seen in primary care on Re-est Pt.  Called Nurse Triage reporting Leg Pain and New Patient (Initial Visit).  Symptoms began several years ago.  Interventions attempted: Nothing.  Symptoms are: gradually worsening.  Triage Disposition: See PCP When Office is Open (Within 3 Days)  Patient/caregiver understands and will follow disposition?: Yes     Copied from CRM [id number]. Topic: Clinical - Red Word Triage >> Mar 14, 2024  8:28 AM Suzen RAMAN wrote: Red Word that prompted transfer to Nurse Triage: lumps on left leg.. tender to the touch a little pain to the touch. Requesting an appt this week if available Reason for Disposition  Localized pain, redness or hard lump along vein  Answer Assessment - Initial Assessment Questions 1. ONSET: When did the pain start?      3 years ago, worsening x 1 month 2. LOCATION: Where is the pain located?      L leg 3. PAIN: How bad is the pain?    (Scale 1-10; or mild, moderate, severe)     3 lumps Tender to touch 4. WORK OR EXERCISE: Has there been any recent work or exercise that involved this part of the body?      denies 5. CAUSE: What do you think is causing the leg pain?     unknown 6. OTHER SYMPTOMS: Do you have any other symptoms? (e.g., chest pain, back pain, breathing difficulty, swelling, rash, fever, numbness, weakness)     denies 7. PREGNANCY: Is there any chance you are pregnant? When was your last menstrual period?     N/a    Pt calling to re-establish care with Dr Candise. Pt has chronic concern that has worsened x 1 month. Scheduled patient on the next available New Pt appt on March 16, 2024 with Dr Candise .  Protocols used: Leg Pain-A-AH

## 2024-03-16 ENCOUNTER — Ambulatory Visit: Payer: Self-pay | Admitting: Family Medicine

## 2024-03-16 ENCOUNTER — Encounter: Payer: Self-pay | Admitting: Family Medicine

## 2024-03-16 VITALS — BP 123/82 | HR 72 | Temp 98.2°F | Ht 65.0 in | Wt 149.0 lb

## 2024-03-16 DIAGNOSIS — D1724 Benign lipomatous neoplasm of skin and subcutaneous tissue of left leg: Secondary | ICD-10-CM

## 2024-03-16 DIAGNOSIS — Z1211 Encounter for screening for malignant neoplasm of colon: Secondary | ICD-10-CM | POA: Diagnosis not present

## 2024-03-16 DIAGNOSIS — Z0000 Encounter for general adult medical examination without abnormal findings: Secondary | ICD-10-CM | POA: Diagnosis not present

## 2024-03-16 NOTE — Progress Notes (Signed)
 Office Note 03/16/2024  CC:  Chief Complaint  Patient presents with   Establish Care   HPI:  Angela Harper is a 48 y.o.  female who is here to re- establish care,discuss lump on leg. Patient's most recent primary MD: None Old records in epic/health Link EMR were reviewed prior to or during today's visit.  I last saw her back in October 2021.  She has been healthy.  She sees her allergist regularly for allergic rhinitis and asthma, both well-controlled.  She is concerned about slightly enlarging lumps in the left thigh.  They are little bit tender lately.  Past Medical History:  Diagnosis Date   Allergic rhinoconjunctivitis    Anxiety and depression    Asthma    mild persistent->flovent  for worsening control/flares, +prn albut. Spirometry normal 10/2019 (Dr. Ruthy)   Conductive hearing loss, bilateral    Chronic R TM perf.  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   TONSILLECTOMY     child   TONSILLECTOMY     TYMPANOSTOMY TUBE PLACEMENT Bilateral    child    Family History  Problem Relation Age of Onset   Hearing loss Father    Hypertension Father    Arthritis Father    Hyperlipidemia Father     Social History   Socioeconomic History   Marital status: Married    Spouse name: Not on file   Number of children: Not on file   Years of education: Not on file   Highest education level: Not on file  Occupational History   Not on file  Tobacco Use   Smoking status: Never   Smokeless tobacco: Never  Vaping Use   Vaping status:  Never Used  Substance and Sexual Activity   Alcohol use: Yes    Comment: Rare   Drug use: Never   Sexual activity: Yes    Comment: married  Other Topics Concern   Not on file  Social History Narrative   Married,   Education: Masters degree.   Occupation: Facilities manager and Solicitor.   Never smoker.   Social Drivers of Corporate investment banker Strain: Not on file  Food Insecurity: Not on file  Transportation Needs: Not on file  Physical Activity: Not on file  Stress: Not on file  Social Connections: Not on file  Intimate Partner Violence: Not on file    Outpatient Encounter Medications as of 03/16/2024  Medication Sig   HAILEY 24 FE 1-20 MG-MCG(24) tablet Take 1 tablet by mouth daily.   levocetirizine (XYZAL ) 5 MG tablet TAKE 1 TABLET BY MOUTH EVERY DAY IN THE EVENING   meloxicam (MOBIC) 7.5 MG tablet Take by mouth as needed.   montelukast  (SINGULAIR ) 10 MG tablet TAKE 1 TABLET BY MOUTH EVERYDAY AT BEDTIME   Multiple Vitamin (MULTIVITAMIN) tablet Take 1 tablet by mouth daily.   Probiotic Product (PROBIOTIC PO) Take 1 capsule by mouth daily.   albuterol  (VENTOLIN  HFA) 108 (90 Base) MCG/ACT inhaler Inhale 2 puffs into the lungs every 4 (four) hours as needed for wheezing  or shortness of breath (coughing fits). (Patient not taking: Reported on 03/16/2024)   triamcinolone  cream (KENALOG ) 0.1 % Apply 1 Application topically 2 (two) times daily as needed. (Patient not taking: Reported on 03/16/2024)   No facility-administered encounter medications on file as of 03/16/2024.    Allergies  Allergen Reactions   Codeine Nausea Only    Review of Systems  Constitutional:  Negative for appetite change, chills, fatigue and fever.  HENT:  Negative for congestion, dental problem, ear pain and sore throat.   Eyes:  Negative for discharge, redness and visual disturbance.  Respiratory:  Negative for cough, chest tightness, shortness of breath and wheezing.   Cardiovascular:  Negative  for chest pain, palpitations and leg swelling.  Gastrointestinal:  Negative for abdominal pain, blood in stool, diarrhea, nausea and vomiting.  Genitourinary:  Negative for difficulty urinating, dysuria, flank pain, frequency, hematuria and urgency.  Musculoskeletal:  Negative for arthralgias, back pain, joint swelling, myalgias and neck stiffness.  Skin:  Negative for pallor and rash.  Neurological:  Negative for dizziness, speech difficulty, weakness and headaches.  Hematological:  Negative for adenopathy. Does not bruise/bleed easily.  Psychiatric/Behavioral:  Negative for confusion and sleep disturbance. The patient is not nervous/anxious.    PE; Blood pressure 123/82, pulse 72, temperature 98.2 F (36.8 C), temperature source Oral, height 5' 5 (1.651 m), weight 149 lb (67.6 kg), last menstrual period 03/09/2024, SpO2 100%. Body mass index is 24.79 kg/m.  Physical Exam  Exam chaperoned by Shanda Pizza, CMA Gen: Alert, well appearing.  Patient is oriented to person, place, time, and situation. AFFECT: pleasant, lucid thought and speech. ENT: Ears: EACs clear, normal epithelium.  TMs with good light reflex and landmarks bilaterally.  Eyes: no injection, icteris, swelling, or exudate.  EOMI, PERRLA. Nose: no drainage or turbinate edema/swelling.  No injection or focal lesion.  Mouth: lips without lesion/swelling.  Oral mucosa pink and moist.  Dentition intact and without obvious caries or gingival swelling.  Oropharynx without erythema, exudate, or swelling.  Neck: supple/nontender.  No LAD, mass, or TM.  Carotid pulses 2+ bilaterally, without bruits. CV: RRR, no m/r/g.   LUNGS: CTA bilat, nonlabored resps, good aeration in all lung fields. ABD: soft, NT, ND, BS normal.  No hepatospenomegaly or mass.  No bruits. EXT: no clubbing, cyanosis, or edema.  Left thigh with 3 distinct palpable soft and rubbery nodules about 2 cm in size without overlying skin changes.  They are mobile and a bit  tender to touch. Musculoskeletal: no joint swelling, erythema, warmth, or tenderness.  ROM of all joints intact. Skin - no sores or suspicious lesions or rashes or color changes  Pertinent labs:  None  ASSESSMENT AND PLAN:   New patient, reestablishing care.  #1 health maintenance exam: Reviewed age and gender appropriate health maintenance issues (prudent diet, regular exercise, health risks of tobacco and excessive alcohol, use of seatbelts, fire alarms in home, use of sunscreen).  Also reviewed age and gender appropriate health screening as well as vaccine recommendations. Vaccines: Tdap->declined.  Flu->declines.  Prevnar->declined. Labs: Health panel labs declined Cervical ca screening: Per GYN MD--UTD Breast ca screening: Mammogram--UTD at GYN Colon ca screening: average risk patient---> due for initial screening.  Options discussed-->iFOB.  #2 lipomas left thigh. These are fairly small.  I looked at these with the ultrasound today and they are homogenous fatty echoes well separated from the underlying muscle. Reassured. An After Visit Summary was printed and given to the patient.  Return in about  1 year (around 03/16/2025) for annual CPE (fasting).  Signed:  Gerlene Hockey, MD           03/16/2024

## 2024-03-16 NOTE — Patient Instructions (Signed)

## 2025-03-07 ENCOUNTER — Ambulatory Visit: Admitting: Allergy & Immunology
